# Patient Record
Sex: Female | Born: 1960
Health system: Southern US, Community
[De-identification: ages and names within clinical notes are randomized; demographics above are authoritative.]

## PROBLEM LIST (undated history)

## (undated) DIAGNOSIS — K219 Gastro-esophageal reflux disease without esophagitis: Secondary | ICD-10-CM

## (undated) DIAGNOSIS — C4490 Unspecified malignant neoplasm of skin, unspecified: Secondary | ICD-10-CM

## (undated) DIAGNOSIS — K589 Irritable bowel syndrome without diarrhea: Secondary | ICD-10-CM

## (undated) DIAGNOSIS — T7492XA Unspecified child maltreatment, confirmed, initial encounter: Secondary | ICD-10-CM

## (undated) DIAGNOSIS — Z8711 Personal history of peptic ulcer disease: Secondary | ICD-10-CM

## (undated) DIAGNOSIS — F32A Depression, unspecified: Secondary | ICD-10-CM

## (undated) DIAGNOSIS — F419 Anxiety disorder, unspecified: Secondary | ICD-10-CM

## (undated) DIAGNOSIS — IMO0002 Reserved for concepts with insufficient information to code with codable children: Secondary | ICD-10-CM

## (undated) DIAGNOSIS — Z8719 Personal history of other diseases of the digestive system: Secondary | ICD-10-CM

## (undated) DIAGNOSIS — C4492 Squamous cell carcinoma of skin, unspecified: Secondary | ICD-10-CM

## (undated) DIAGNOSIS — S2239XA Fracture of one rib, unspecified side, initial encounter for closed fracture: Secondary | ICD-10-CM

## (undated) DIAGNOSIS — F329 Major depressive disorder, single episode, unspecified: Secondary | ICD-10-CM

## (undated) DIAGNOSIS — J449 Chronic obstructive pulmonary disease, unspecified: Secondary | ICD-10-CM

## (undated) DIAGNOSIS — F410 Panic disorder [episodic paroxysmal anxiety] without agoraphobia: Secondary | ICD-10-CM

## (undated) DIAGNOSIS — C4491 Basal cell carcinoma of skin, unspecified: Secondary | ICD-10-CM

## (undated) HISTORY — DX: Gastro-esophageal reflux disease without esophagitis: K21.9

## (undated) HISTORY — DX: Major depressive disorder, single episode, unspecified: F32.9

## (undated) HISTORY — DX: Unspecified malignant neoplasm of skin, unspecified: C44.90

## (undated) HISTORY — DX: Panic disorder (episodic paroxysmal anxiety): F41.0

## (undated) HISTORY — DX: Personal history of peptic ulcer disease: Z87.11

## (undated) HISTORY — DX: Basal cell carcinoma of skin, unspecified: C44.91

## (undated) HISTORY — DX: Biological father, perpetrator of maltreatment and neglect: T74.92XA

## (undated) HISTORY — DX: Squamous cell carcinoma of skin, unspecified: C44.92

## (undated) HISTORY — DX: Chronic obstructive pulmonary disease, unspecified: J44.9

## (undated) HISTORY — DX: Reserved for concepts with insufficient information to code with codable children: IMO0002

## (undated) HISTORY — DX: Personal history of other diseases of the digestive system: Z87.19

## (undated) HISTORY — DX: Anxiety disorder, unspecified: F41.9

## (undated) HISTORY — DX: Depression, unspecified: F32.A

## (undated) HISTORY — DX: Irritable bowel syndrome, unspecified: K58.9

## (undated) HISTORY — DX: Fracture of one rib, unspecified side, initial encounter for closed fracture: S22.39XA

---
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
## 1983-12-09 HISTORY — PX: OTHER SURGICAL HISTORY: SHX169

## 1984-12-08 DIAGNOSIS — S2249XA Multiple fractures of ribs, unspecified side, initial encounter for closed fracture: Secondary | ICD-10-CM

## 1984-12-08 HISTORY — DX: Multiple fractures of ribs, unspecified side, initial encounter for closed fracture: S22.49XA

## 2000-12-08 HISTORY — PX: TUBAL LIGATION: SHX77

## 2005-05-29 ENCOUNTER — Ambulatory Visit: Payer: Self-pay | Admitting: General Surgery

## 2005-06-20 ENCOUNTER — Ambulatory Visit: Payer: Self-pay | Admitting: Obstetrics and Gynecology

## 2005-07-24 ENCOUNTER — Ambulatory Visit: Payer: Self-pay | Admitting: Gastroenterology

## 2005-08-06 ENCOUNTER — Ambulatory Visit: Payer: Self-pay | Admitting: Gastroenterology

## 2006-03-05 ENCOUNTER — Ambulatory Visit: Payer: Self-pay | Admitting: Gastroenterology

## 2006-03-05 ENCOUNTER — Encounter: Payer: Self-pay | Admitting: Internal Medicine

## 2006-06-22 ENCOUNTER — Ambulatory Visit: Payer: Self-pay | Admitting: Obstetrics and Gynecology

## 2006-07-13 ENCOUNTER — Encounter: Payer: Self-pay | Admitting: Internal Medicine

## 2007-08-10 ENCOUNTER — Ambulatory Visit: Payer: Self-pay | Admitting: Obstetrics and Gynecology

## 2008-08-03 ENCOUNTER — Ambulatory Visit: Payer: Self-pay | Admitting: Family Medicine

## 2008-12-26 ENCOUNTER — Ambulatory Visit: Payer: Self-pay | Admitting: Family Medicine

## 2008-12-26 DIAGNOSIS — F331 Major depressive disorder, recurrent, moderate: Secondary | ICD-10-CM | POA: Insufficient documentation

## 2008-12-26 DIAGNOSIS — K589 Irritable bowel syndrome without diarrhea: Secondary | ICD-10-CM | POA: Insufficient documentation

## 2008-12-27 DIAGNOSIS — F411 Generalized anxiety disorder: Secondary | ICD-10-CM

## 2008-12-27 DIAGNOSIS — K219 Gastro-esophageal reflux disease without esophagitis: Secondary | ICD-10-CM | POA: Insufficient documentation

## 2008-12-27 DIAGNOSIS — Z8719 Personal history of other diseases of the digestive system: Secondary | ICD-10-CM | POA: Insufficient documentation

## 2008-12-27 DIAGNOSIS — J309 Allergic rhinitis, unspecified: Secondary | ICD-10-CM | POA: Insufficient documentation

## 2008-12-27 DIAGNOSIS — R32 Unspecified urinary incontinence: Secondary | ICD-10-CM | POA: Insufficient documentation

## 2008-12-27 HISTORY — DX: Generalized anxiety disorder: F41.1

## 2009-01-22 ENCOUNTER — Ambulatory Visit: Payer: Self-pay | Admitting: Family Medicine

## 2009-01-22 ENCOUNTER — Encounter: Payer: Self-pay | Admitting: Family Medicine

## 2009-01-24 ENCOUNTER — Encounter (INDEPENDENT_AMBULATORY_CARE_PROVIDER_SITE_OTHER): Payer: Self-pay | Admitting: *Deleted

## 2009-02-19 LAB — HM PAP SMEAR

## 2009-02-28 ENCOUNTER — Ambulatory Visit: Payer: Self-pay | Admitting: Family Medicine

## 2009-02-28 DIAGNOSIS — R5381 Other malaise: Secondary | ICD-10-CM | POA: Insufficient documentation

## 2009-02-28 DIAGNOSIS — R5383 Other fatigue: Secondary | ICD-10-CM

## 2009-03-14 ENCOUNTER — Ambulatory Visit: Payer: Self-pay | Admitting: Family Medicine

## 2009-03-15 LAB — CONVERTED CEMR LAB
Albumin: 4.2 g/dL (ref 3.5–5.2)
Basophils Relative: 0.6 % (ref 0.0–3.0)
Bilirubin, Direct: 0.1 mg/dL (ref 0.0–0.3)
CO2: 27 meq/L (ref 19–32)
Calcium: 9.4 mg/dL (ref 8.4–10.5)
Creatinine, Ser: 0.6 mg/dL (ref 0.4–1.2)
Eosinophils Relative: 3 % (ref 0.0–5.0)
HDL: 37.9 mg/dL — ABNORMAL LOW (ref >39.00)
Hemoglobin: 16.4 g/dL — ABNORMAL HIGH (ref 12.0–15.0)
Lipase: 21 units/L (ref 11.0–59.0)
Lymphocytes Relative: 18.8 % (ref 12.0–46.0)
MCHC: 35.3 g/dL (ref 30.0–36.0)
Monocytes Relative: 4.9 % (ref 3.0–12.0)
Neutro Abs: 6.5 10*3/uL (ref 1.4–7.7)
RBC: 5.25 M/uL — ABNORMAL HIGH (ref 3.87–5.11)
Total CHOL/HDL Ratio: 5
Total Protein: 6.9 g/dL (ref 6.0–8.3)
Triglycerides: 88 mg/dL (ref 0.0–149.0)

## 2009-05-28 ENCOUNTER — Telehealth: Payer: Self-pay | Admitting: Family Medicine

## 2009-05-29 ENCOUNTER — Ambulatory Visit: Payer: Self-pay | Admitting: Family Medicine

## 2009-05-29 DIAGNOSIS — K259 Gastric ulcer, unspecified as acute or chronic, without hemorrhage or perforation: Secondary | ICD-10-CM | POA: Insufficient documentation

## 2009-05-29 LAB — CONVERTED CEMR LAB
Eosinophils Relative: 3.5 % (ref 0.0–5.0)
H Pylori IgG: NEGATIVE
HCT: 46.2 % — ABNORMAL HIGH (ref 36.0–46.0)
Hemoglobin: 16.4 g/dL — ABNORMAL HIGH (ref 12.0–15.0)
Lymphs Abs: 1.8 10*3/uL (ref 0.7–4.0)
Monocytes Relative: 7.5 % (ref 3.0–12.0)
Neutro Abs: 4.8 10*3/uL (ref 1.4–7.7)
WBC: 7.5 10*3/uL (ref 4.5–10.5)

## 2009-05-30 ENCOUNTER — Telehealth: Payer: Self-pay | Admitting: Family Medicine

## 2009-06-12 ENCOUNTER — Telehealth: Payer: Self-pay | Admitting: Family Medicine

## 2009-07-02 ENCOUNTER — Ambulatory Visit: Payer: Self-pay | Admitting: Family Medicine

## 2009-07-02 DIAGNOSIS — M7710 Lateral epicondylitis, unspecified elbow: Secondary | ICD-10-CM | POA: Insufficient documentation

## 2009-07-04 ENCOUNTER — Telehealth: Payer: Self-pay | Admitting: Family Medicine

## 2009-07-25 ENCOUNTER — Ambulatory Visit: Payer: Self-pay | Admitting: Internal Medicine

## 2009-07-26 ENCOUNTER — Telehealth: Payer: Self-pay | Admitting: Internal Medicine

## 2009-09-03 ENCOUNTER — Ambulatory Visit: Payer: Self-pay | Admitting: Family Medicine

## 2009-09-03 DIAGNOSIS — G4700 Insomnia, unspecified: Secondary | ICD-10-CM | POA: Insufficient documentation

## 2009-09-18 ENCOUNTER — Telehealth: Payer: Self-pay | Admitting: Family Medicine

## 2009-10-15 ENCOUNTER — Ambulatory Visit: Payer: Self-pay | Admitting: Family Medicine

## 2009-11-12 ENCOUNTER — Ambulatory Visit: Payer: Self-pay | Admitting: Family Medicine

## 2009-11-23 ENCOUNTER — Telehealth: Payer: Self-pay | Admitting: Family Medicine

## 2009-12-13 ENCOUNTER — Ambulatory Visit: Payer: Self-pay | Admitting: Internal Medicine

## 2009-12-13 ENCOUNTER — Ambulatory Visit: Payer: Self-pay

## 2009-12-13 ENCOUNTER — Encounter: Payer: Self-pay | Admitting: Family Medicine

## 2009-12-13 ENCOUNTER — Encounter (INDEPENDENT_AMBULATORY_CARE_PROVIDER_SITE_OTHER): Payer: Self-pay | Admitting: *Deleted

## 2009-12-13 ENCOUNTER — Ambulatory Visit (HOSPITAL_COMMUNITY): Admission: RE | Admit: 2009-12-13 | Discharge: 2009-12-13 | Payer: Self-pay | Admitting: Family Medicine

## 2009-12-24 ENCOUNTER — Ambulatory Visit: Payer: Self-pay | Admitting: Family Medicine

## 2009-12-24 DIAGNOSIS — F17200 Nicotine dependence, unspecified, uncomplicated: Secondary | ICD-10-CM | POA: Insufficient documentation

## 2009-12-24 DIAGNOSIS — J449 Chronic obstructive pulmonary disease, unspecified: Secondary | ICD-10-CM | POA: Insufficient documentation

## 2009-12-25 ENCOUNTER — Telehealth: Payer: Self-pay | Admitting: Family Medicine

## 2009-12-26 ENCOUNTER — Encounter: Admission: RE | Admit: 2009-12-26 | Discharge: 2009-12-26 | Payer: Self-pay | Admitting: Family Medicine

## 2010-02-18 ENCOUNTER — Ambulatory Visit: Payer: Self-pay | Admitting: Family Medicine

## 2010-03-11 ENCOUNTER — Ambulatory Visit: Payer: Self-pay | Admitting: Family Medicine

## 2010-03-18 ENCOUNTER — Telehealth: Payer: Self-pay | Admitting: Family Medicine

## 2010-03-22 ENCOUNTER — Telehealth: Payer: Self-pay | Admitting: Family Medicine

## 2010-04-18 ENCOUNTER — Ambulatory Visit: Payer: Self-pay | Admitting: Family Medicine

## 2010-04-18 DIAGNOSIS — R252 Cramp and spasm: Secondary | ICD-10-CM | POA: Insufficient documentation

## 2010-04-22 ENCOUNTER — Encounter: Payer: Self-pay | Admitting: Family Medicine

## 2010-04-22 LAB — CONVERTED CEMR LAB
BUN: 11 mg/dL (ref 6–23)
Basophils Absolute: 0 10*3/uL (ref 0.0–0.1)
Basophils Relative: 0.4 % (ref 0.0–3.0)
CO2: 28 meq/L (ref 19–32)
Calcium: 9.5 mg/dL (ref 8.4–10.5)
Chloride: 101 meq/L (ref 96–112)
Creatinine, Ser: 0.6 mg/dL (ref 0.4–1.2)
Direct LDL: 119.8 mg/dL
Eosinophils Absolute: 0.2 10*3/uL (ref 0.0–0.7)
Eosinophils Relative: 2.2 % (ref 0.0–5.0)
Ferritin: 98.7 ng/mL (ref 10.0–291.0)
GFR calc non Af Amer: 119.96 mL/min (ref >60)
Glucose, Bld: 110 mg/dL — ABNORMAL HIGH (ref 70–99)
HCT: 46.6 % — ABNORMAL HIGH (ref 36.0–46.0)
Hemoglobin: 16.3 g/dL — ABNORMAL HIGH (ref 12.0–15.0)
Lymphocytes Relative: 24.9 % (ref 12.0–46.0)
Lymphs Abs: 2.8 10*3/uL (ref 0.7–4.0)
MCHC: 35 g/dL (ref 30.0–36.0)
MCV: 90.2 fL (ref 78.0–100.0)
Monocytes Absolute: 0.7 10*3/uL (ref 0.1–1.0)
Monocytes Relative: 5.9 % (ref 3.0–12.0)
Neutro Abs: 7.4 10*3/uL (ref 1.4–7.7)
Neutrophils Relative %: 66.6 % (ref 43.0–77.0)
Platelets: 220 10*3/uL (ref 150.0–400.0)
Potassium: 4 meq/L (ref 3.5–5.1)
RBC: 5.17 M/uL — ABNORMAL HIGH (ref 3.87–5.11)
RDW: 13.8 % (ref 11.5–14.6)
Sodium: 139 meq/L (ref 135–145)
TSH: 1.01 microintl units/mL (ref 0.35–5.50)
WBC: 11.1 10*3/uL — ABNORMAL HIGH (ref 4.5–10.5)

## 2010-05-13 ENCOUNTER — Telehealth: Payer: Self-pay | Admitting: Family Medicine

## 2010-06-03 ENCOUNTER — Telehealth (INDEPENDENT_AMBULATORY_CARE_PROVIDER_SITE_OTHER): Payer: Self-pay | Admitting: *Deleted

## 2010-08-16 ENCOUNTER — Telehealth: Payer: Self-pay | Admitting: Family Medicine

## 2010-09-21 IMAGING — MG MAM DGTL SCREENING MAMMO W/CAD
1 series · 4 of 4 positions shown · non-contrast
Comparison: none

REASON FOR EXAM: scr mammo
COMMENTS:

[R CC · right · 4 of 4 slices shown]
[im 1/4]
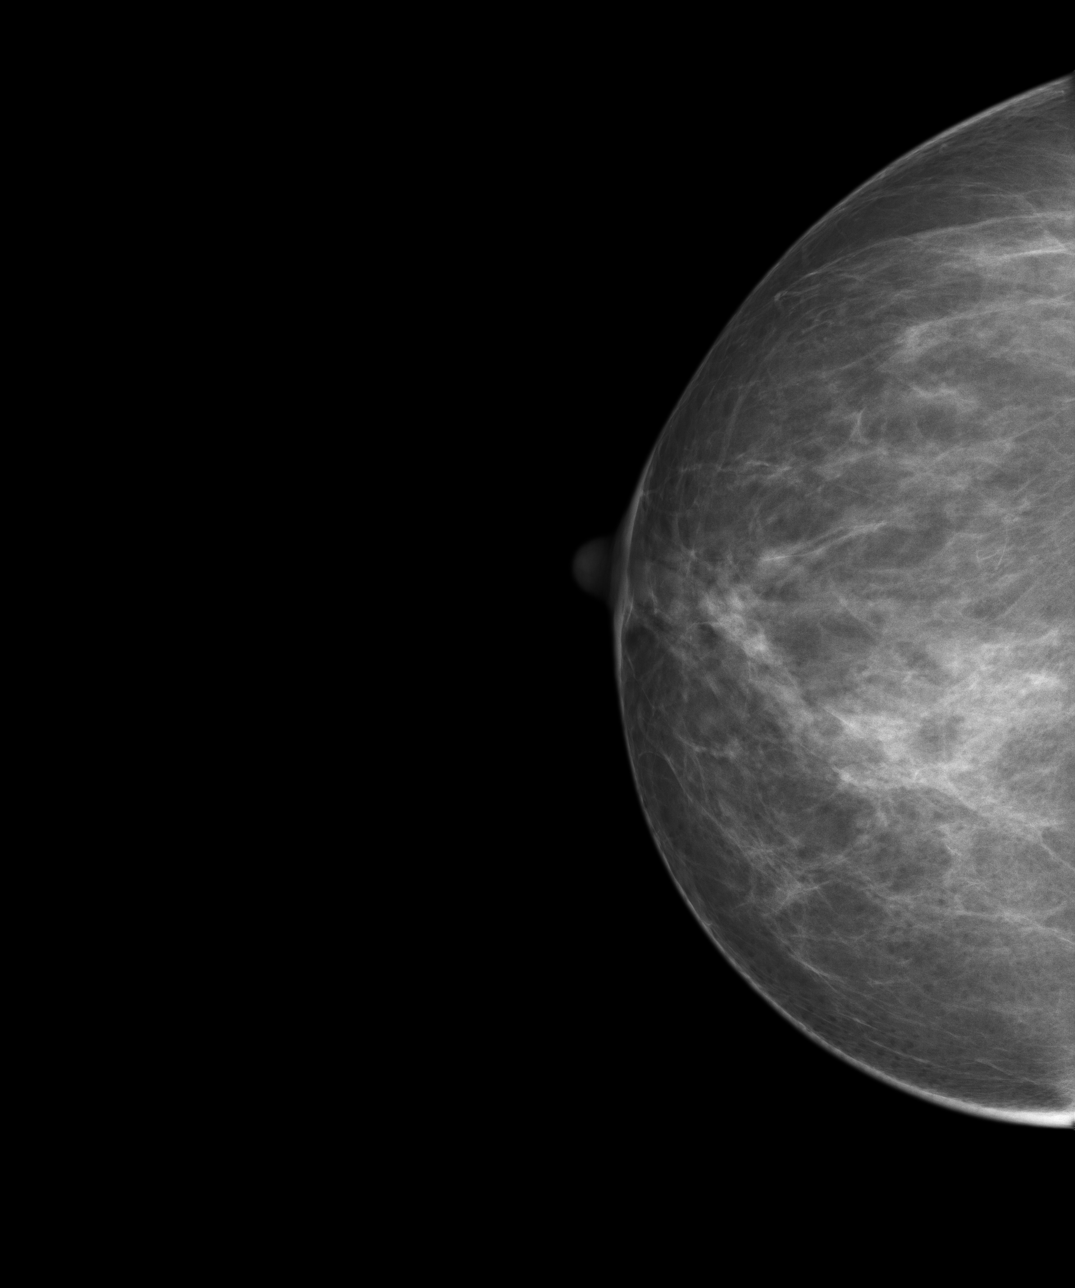
[im 2/4]
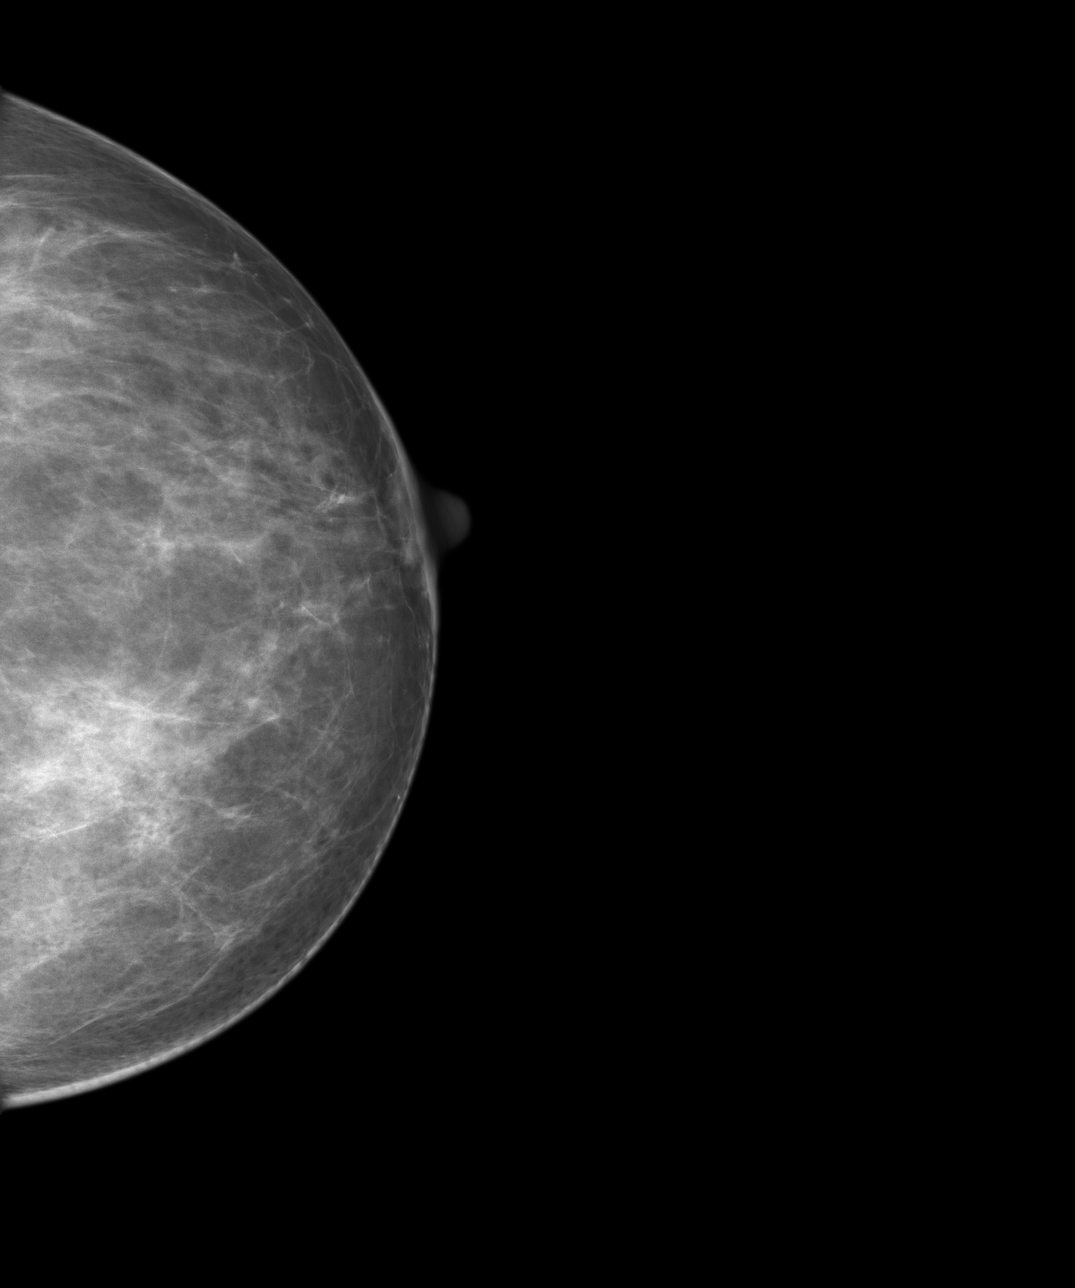
[im 3/4]
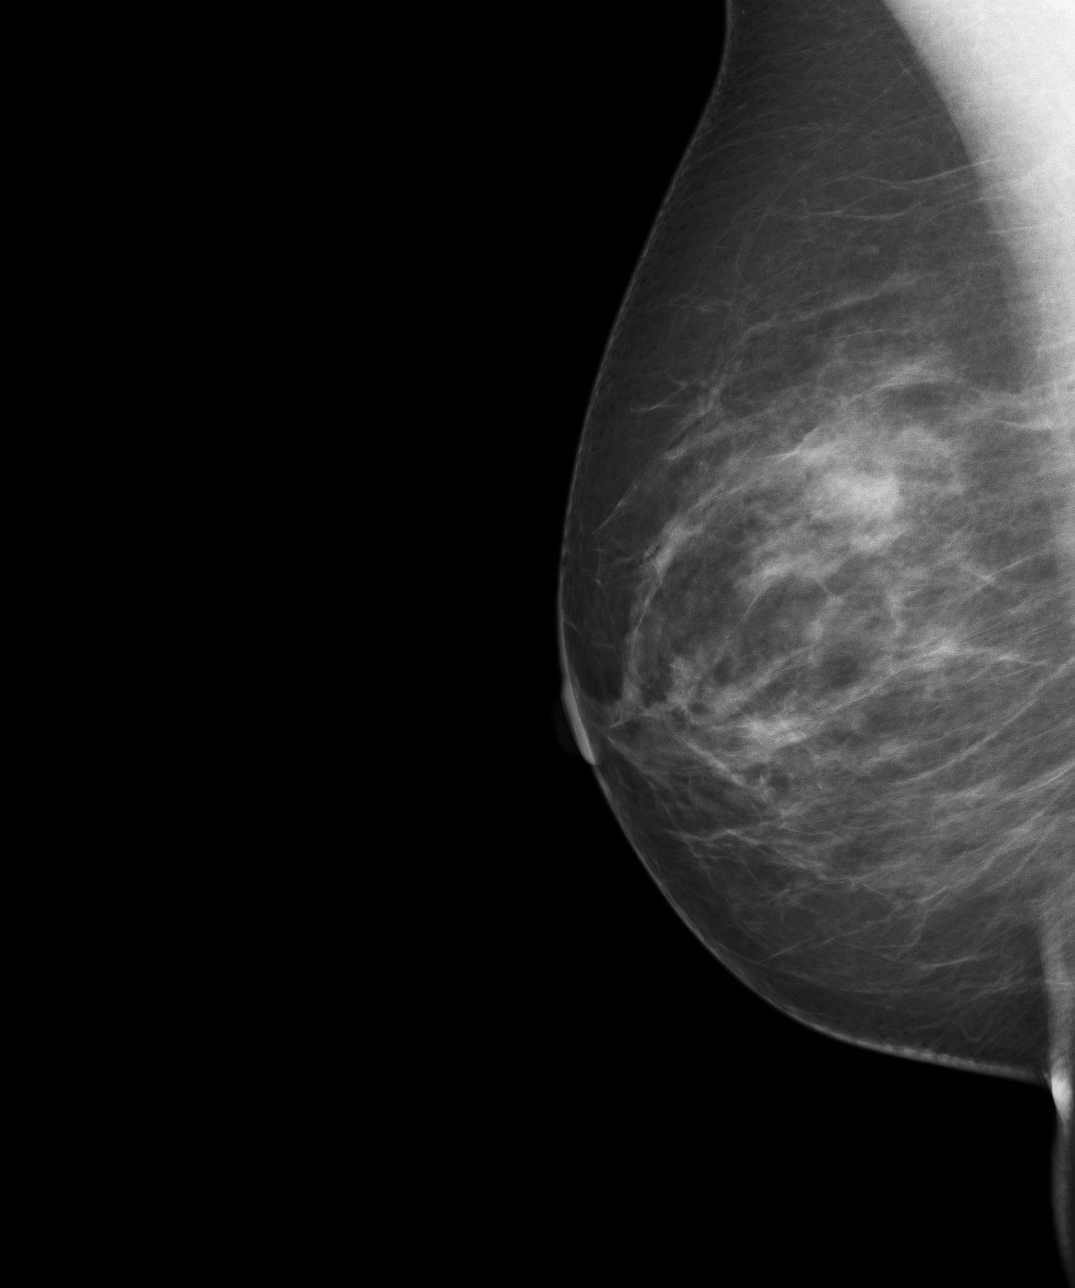
[im 4/4]
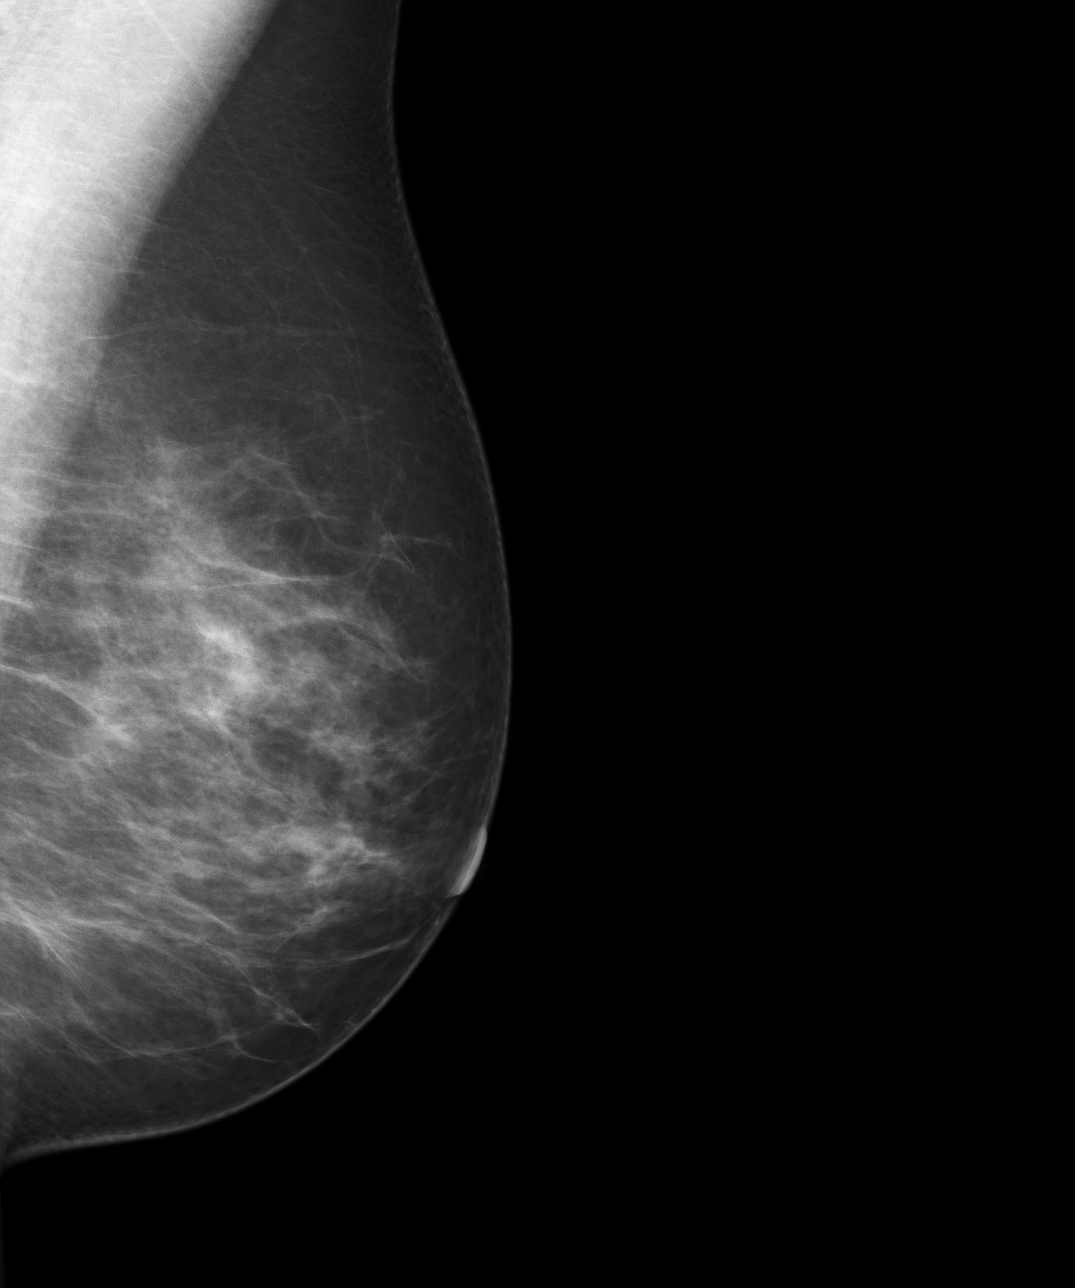

[4 of 4 positions shown; findings below may reference images not displayed]

PROCEDURE:     MAM - MAM DGTL SCREENING MAMMO W/CAD  - January 22, 2009  [DATE]

RESULT:       Comparison is made to a study 10 August, 2007,22 June, 2006 and 31 March, 2002.

The breasts are moderately dense.  There is no dominant mass. I see no
malignant-appearing grouping of microcalcification.  No area of new
architectural distortion is seen.
IMPRESSION: 1.     I see no finding suspicious for malignancy.
2.     BI-RADS:  Category 2-Benign Finding.

RECOMMENDATION:  Please continue to encourage yearly mammographic followup.

A NEGATIVE MAMMOGRAM REPORT DOES NOT PRECLUDE BIOPSY OR OTHER EVALUATION OF
A CLINICALLY PALPABLE OR OTHERWISE SUSPICIOUS MASS OR LESION.  BREAST CANCER
MAY NOT BE DETECTED BY MAMMOGRAPHY IN UP TO 10% OF CASES.

## 2010-10-03 ENCOUNTER — Encounter: Payer: Self-pay | Admitting: Family Medicine

## 2010-10-03 ENCOUNTER — Ambulatory Visit: Payer: Self-pay | Admitting: Family Medicine

## 2010-12-04 ENCOUNTER — Telehealth (INDEPENDENT_AMBULATORY_CARE_PROVIDER_SITE_OTHER): Payer: Self-pay | Admitting: *Deleted

## 2011-01-08 NOTE — Progress Notes (Signed)
Summary: call a nurse  Phone Note From Other Clinic   Caller: call a nurse Summary of Call: [facility] Triage Call Report Triage Record Num: [id number] Operator: Yvette Rack Patient Name: Lauriann Milillo Call Date & Time: 05/11/2010 4:58:03PM Patient Phone: [phone] PCP: Karleen Hampshire Copland Patient Gender: Female PCP Fax : [phone] Patient DOB: [birth year] Practice Name: [facility] Reason for Call: Pt states currently taking Citalopram 30 mg po qd. Pt states has enough medication for tomorrow (Sunday). RN instructed pt she will need to call back Monday am when office open to request further refills of medication. Caller verbalizes understanding. Protocol(s) Used: Medication Question Calls, No Triage (Adults) Recommended Outcome per Protocol: Call Provider within 24 Hours Reason for Outcome: Caller requesting a non urgent new prescription or refill and triager unable to refill per unit policy Care Advice:  ~ 05/11/2010 5:11:14PM Page 1 of 1 [email or web address] Initial call taken by: Laurie Branson CMA,  May 13, 2010 10:08 AM  Follow-up for Phone Call        can you help fix her citalopram script  recently refilled, was supposed to have been 1 1/2 tabs by mouth daily, #45, 5 refills Follow-up by: Spencer Copland MD,  May 13, 2010 5:32 PM    Prescriptions: CITALOPRAM HYDROBROMIDE 20 MG TABS (CITALOPRAM HYDROBROMIDE) take one and a half  tablets by mouth daily  #45 x 4   Entered by:   Heather Woodard CMA (AAMA)   Authorized by:   Spencer Copland MD   Signed by:   Heather Woodard CMA (AAMA) on 05/14/2010   Method used:   [facility]  [address] [id number]       [address]       [place], [place]  [address]       Ph: [phone]       Fax: [phone]   RxID:   [id number]

## 2011-01-08 NOTE — Miscellaneous (Signed)
Summary: Controlled Substances Contract  Controlled Substances Contract   Imported By: Maryln Gottron 10/09/2010 10:53:43  _____________________________________________________________________  External Attachment:    Type:   Image     Comment:   External Document

## 2011-01-08 NOTE — Progress Notes (Signed)
Summary: refill request for alprazolam  Phone Note Refill Request   Refills Requested: Medication #1:  ALPRAZOLAM 0.25 MG TBDP take one tablet once daily   Last Refilled: 02/22/2010 Faxed request from [facility], phone [phone]  Initial call taken by: Lowella Petties CMA,  March 22, 2010 8:56 AM  Follow-up for Phone Call        [facility] [place] Follow-up by: Lowella Petties CMA,  March 22, 2010 5:43 PM    Prescriptions: ALPRAZOLAM 0.25 MG TBDP (ALPRAZOLAM) take one tablet once daily  #30 x 1   Entered and Authorized by:   Hannah Beat MD   Signed by:   Hannah Beat MD on 03/22/2010   Method used:   Telephoned to ...       [facility] [phone]* (retail)       [address]       [place], [place]  [address]       Ph: [phone]       Fax: [phone]   RxID:   [id number]
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
## 2011-01-08 NOTE — Progress Notes (Signed)
Summary: chest xray  Phone Note Call from Patient   Caller: Patient Summary of Call: Patient wants to know if you can order a chest x-ray for her to check her lungs since she was diagnosed with copd. Initial call taken by: Benny Lennert CMA Duncan Dull),  December 25, 2009 8:32 AM  Follow-up for Phone Call        she has copd clinically and diagnosed with spirometry yesterday. The diagnosis is certain.  With her long history of smoking, a chest xray is reasonable Follow-up by: Hannah Beat MD,  December 25, 2009 9:00 AM  Additional Follow-up for Phone Call Additional follow up Details #1::        [facility] on 12/26/2009 patient will walk in , order faxed to [phone] on 12/25/2009.  Carlton Adam  December 25, 2009 4:42 PM  Additional Follow-up by: Carlton Adam,  December 25, 2009 4:42 PM
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
## 2011-01-08 NOTE — Progress Notes (Signed)
Summary: Need precription...  Phone Note Call from Patient   Caller: Patient Call For: [id number] Summary of Call: Pt called says the Pharmacy does not have the new precriptions for Citalopram Hydrobromide 20mg  45 x 4 was not recd by the pharmacy.  [facility]Marland KitchenDaine Gip  June 03, 2010 8:59 AM  Initial call taken by: Daine Gip,  June 03, 2010 9:00 AM  Follow-up for Phone Call        rx resent because pharmacy did not recieve Follow-up by: Benny Lennert CMA Duncan Dull),  June 03, 2010 9:04 AM    Prescriptions: CITALOPRAM HYDROBROMIDE 20 MG TABS (CITALOPRAM HYDROBROMIDE) take one and a half  tablets by mouth daily  #45 x 4   Entered by:   Benny Lennert CMA (AAMA)   Authorized by:   Hannah Beat MD   Signed by:   Benny Lennert CMA (AAMA) on 06/03/2010   Method used:   Electronically to        [facility] [phone]* (retail)       [address]       [place], [place]  [address]       Ph: [phone]       Fax: [phone]   RxID:   [id number]

## 2011-01-08 NOTE — Progress Notes (Signed)
Summary: alprazolam   Phone Note Refill Request Message from:  Fax from Pharmacy on August 16, 2010 9:53 AM  Refills Requested: Medication #1:  ALPRAZOLAM 0.25 MG TBDP take one tablet once daily   Last Refilled: 05/11/2010 [facility] [address] [phone]  Initial call taken by: Melody Comas,  August 16, 2010 9:53 AM  Follow-up for Phone Call        Rx called to pharmacy Follow-up by: Linde Gillis CMA Duncan Dull),  August 16, 2010 10:19 AM    Prescriptions: ALPRAZOLAM 0.25 MG TBDP (ALPRAZOLAM) take one tablet once daily  #30 x 1   Entered and Authorized by:   Hannah Beat MD   Signed by:   Hannah Beat MD on 08/16/2010   Method used:   Telephoned to ...       [facility] [phone]* (retail)       [address]       [place], [place]  [address]       Ph: [phone]       Fax: [phone]   RxID:   [id number]

## 2011-01-08 NOTE — Miscellaneous (Signed)
Summary: mirapex called in  Clinical Lists Changes  Medications: Added new medication of MIRAPEX 0.125 MG TABS (PRAMIPEXOLE DIHYDROCHLORIDE) take one by mouth every night for one week then increase to 2 by mouth every night. - Signed Rx of MIRAPEX 0.125 MG TABS (PRAMIPEXOLE DIHYDROCHLORIDE) take one by mouth every night for one week then increase to 2 by mouth every night.;  #60 x 5;  Signed;  Entered by: Lowella Petties CMA;  Authorized by: Hannah Beat MD;  Method used: Electronically to [id number] [address] [place], [place], [place]  [address], Ph: [phone], Fax: [phone]    Prescriptions: MIRAPEX 0.125 MG TABS (PRAMIPEXOLE DIHYDROCHLORIDE) take one by mouth every night for one week then increase to 2 by mouth every night.  #60 x 5   Entered by:   Lowella Petties CMA   Authorized by:   Hannah Beat MD   Signed by:   Lowella Petties CMA on 04/22/2010   Method used:   Electronically to        [facility]  [address] [phone]* (retail)       [address]       [place], [place]  [address]       Ph: [phone]       Fax: [phone]   RxID:   [id number]   Prior Medications: ALPRAZOLAM 0.25 MG TBDP (ALPRAZOLAM) take one tablet once daily CENTRUM  TABS (MULTIPLE VITAMINS-MINERALS) take one by mouth daily LANSOPRAZOLE 30 MG CPDR (LANSOPRAZOLE) 1 by mouth daily TRAZODONE HCL 100 MG TABS (TRAZODONE HCL) 1 by mouth at bedtime as needed sleep CITALOPRAM HYDROBROMIDE 20 MG TABS (CITALOPRAM HYDROBROMIDE) take one and a half  tablets by mouth daily ADVAIR DISKUS 250-50 MCG/DOSE AEPB (FLUTICASONE-SALMETEROL) 1 puff two times a day VENTOLIN HFA 108 (90 BASE) MCG/ACT AERS (ALBUTEROL SULFATE) 2 puffs every 4 hours as needed shortness of breath BUPROPION HCL 150 MG XR24H-TAB (BUPROPION HCL) 1 by mouth daily SUMATRIPTAN SUCCINATE 50 MG TABS (SUMATRIPTAN SUCCINATE) 1 by mouth at onset of headache. May repeat in 2 hours. TRIAMCINOLONE ACETONIDE 0.1 % CREA (TRIAMCINOLONE  ACETONIDE) Apply two times a day Current Allergies: ! PENICILLIN ! * HUMIBID DM

## 2011-01-08 NOTE — Progress Notes (Signed)
Summary: regarding rash  Phone Note Call from Patient Call back at Work Phone [phone]   Caller: Patient Call For: Hannah Beat MD Summary of Call: Pt was in last week with a rash.  She said the medicine has helped some but she had some stress and then she started itching and breaking out again.  She wonders if the rash is coming from anxiety.  She wants your opinion, should she increase her xanax or celexa? Initial call taken by: Lowella Petties CMA,  March 18, 2010 8:20 AM  Follow-up for Phone Call        Increase the Celexa to 1 1/2 tabs by mouth daily call in 1 1/2 by mouth daily, #45, 6 refills to her pharmacy  OK to take xanax as needed  Follow-up by: Hannah Beat MD,  March 18, 2010 9:32 AM    New/Updated Medications: CITALOPRAM HYDROBROMIDE 20 MG TABS (CITALOPRAM HYDROBROMIDE) take one and a half  tablets by mouth daily Prescriptions: CITALOPRAM HYDROBROMIDE 20 MG TABS (CITALOPRAM HYDROBROMIDE) take one and a half  tablets by mouth daily  #45 x 6   Entered by:   Benny Lennert CMA (AAMA)   Authorized by:   Hannah Beat MD   Signed by:   Benny Lennert CMA (AAMA) on 03/18/2010   Method used:   [facility] to        [facility] [phone]* (retail)       [address]       [place], [place]  [address]       Ph: [phone]       Fax: [phone]   RxID:   [id number]

## 2011-01-08 NOTE — Assessment & Plan Note (Signed)
Summary: ITCHY RASH/ lb   Vital Signs:  Patient profile:   50 year old female Height:      64 inches Weight:      136 pounds BMI:     23.43 Temp:     98.2 degrees F oral Pulse rate:   72 / minute Pulse rhythm:   regular BP sitting:   110 / 70  (left arm) Cuff size:   regular  Vitals Entered By: Linde Gillis CMA Duncan Dull) (March 11, 2010 11:53 AM) CC: rash   History of Present Illness: 50 year old:  Rash:  Started saturday  macular reddish rash on arms, abd, back, legs some excoriation took benadryl  ROS: GEN: No acute illnesses, no fevers, chills, sweats, fatigue, weight loss, or URI sx. GI: No n/v/d Pulm: No SOB, cough, wheezing Interactive and getting along well at home.  Otherwise, ROS is as per the HPI.   GEN: Well-developed,well-nourished,in no acute distress; alert,appropriate and cooperative throughout examination HEENT: Normocephalic and atraumatic without obvious abnormalities. No apparent alopecia or balding. Ears, externally no deformities PULM: Breathing comfortably in no respiratory distress EXT: No clubbing, cyanosis, or edema PSYCH: Normally interactive. Cooperative during the interview. Pleasant. Friendly and conversant. Not anxious or depressed appearing. Normal, full affect.   SKIN: macular diffuse rash, no scale, some excoriation  Allergies: 1)  ! Penicillin 2)  ! * Humibid Dm   Impression & Recommendations:  Problem # 1:  RASH-NONVESICULAR (ICD-782.1) unclear etiology supportive, antihistamines and TAC  Her updated medication list for this problem includes:    Triamcinolone Acetonide 0.1 % Crea (Triamcinolone acetonide) .Marland Kitchen... Apply two times a day  Complete Medication List: 1)  Alprazolam 0.25 Mg Tbdp (Alprazolam) .... Take one tablet once daily 2)  Centrum Tabs (Multiple vitamins-minerals) .... Take one by mouth daily 3)  Lansoprazole 30 Mg Cpdr (Lansoprazole) .Marland Kitchen.. 1 by mouth daily 4)  Trazodone Hcl 100 Mg Tabs (Trazodone hcl) .Marland Kitchen.. 1  by mouth at bedtime as needed sleep 5)  Citalopram Hydrobromide 20 Mg Tabs (Citalopram hydrobromide) .... Take one tablet by mouth daily 6)  Advair Diskus 250-50 Mcg/dose Aepb (Fluticasone-salmeterol) .Marland Kitchen.. 1 puff two times a day 7)  Ventolin Hfa 108 (90 Base) Mcg/act Aers (Albuterol sulfate) .... 2 puffs every 4 hours as needed shortness of breath 8)  Bupropion Hcl 150 Mg Xr24h-tab (Bupropion hcl) .Marland Kitchen.. 1 by mouth daily 9)  Sumatriptan Succinate 50 Mg Tabs (Sumatriptan succinate) .Marland Kitchen.. 1 by mouth at onset of headache. may repeat in 2 hours. 10)  Triamcinolone Acetonide 0.1 % Crea (Triamcinolone acetonide) .... Apply two times a day  Patient Instructions: 1)  Generic Loratidine (Claritin) 2)  Benadryl 1/2 tab or 1 tab at night Prescriptions: TRIAMCINOLONE ACETONIDE 0.1 % CREA (TRIAMCINOLONE ACETONIDE) Apply two times a day  #1 pound x 0   Entered and Authorized by:   Hannah Beat MD   Signed by:   Hannah Beat MD on 03/11/2010   Method used:   [facility]        [facility]  [address] [phone]* (retail)       [address]       [place], [place]  [address]       Ph: [phone]       Fax: [phone]   RxID:   [id number]   Current Allergies (reviewed today): ! PENICILLIN ! * HUMIBID DM

## 2011-01-08 NOTE — Assessment & Plan Note (Signed)
Summary: 2 appt6 WK F/U DLO   Vital Signs:  Patient profile:   50 year old female Height:      64 inches Weight:      140.2 pounds BMI:     24.15 Temp:     97.7 degrees F oral Pulse rate:   72 / minute Pulse rhythm:   regular BP sitting:   120 / 82  (left arm) Cuff size:   regular  Vitals Entered By: Benny Lennert CMA Duncan Dull) (December 24, 2009 2:00 PM)  History of Present Illness:  Dep on Celexa, stable, crying occ - some on christmas, but a lot better than before.  SOB: reviewed prior notes. Stress echo normal, reviewed with patient. Feels heart racing sometimes.  SOB, coughing, SOB with exercise, worse now when goes out into cold air. Longstanding smoker. Sister and mother had COPD Able to walk about 20 feet or so sometimes before she gets some symptoms. No history of asthma.   Usually first thing in the morning when going into the cold weather.      Preventive Screening-Counseling & Management  Alcohol-Tobacco     Smoking Status: current     Smoking Cessation Counseling: yes     Smoke Cessation Stage: contemplative     Packs/Day: 1.0     Year Started: 1975     Pack years: 56     Tobacco Counseling: to quit use of tobacco products  Allergies: 1)  ! Penicillin 2)  ! * Humibid Dm  Past History:  Past Medical History: Depression, severe Skin CA, Basal & Squam GERD Gastrointestinal hemorrhage, hx of, 1981 Gastric Ulcer, hx Allergic rhinitis Urinary incontinence Anxiety / Panic Attacks IBS Broken ribs, 1986 COPD  Past Surgical History: Tubal ligation, 2002 Tonsillectomy, 1980 Tendonitis, 1985, DeQuairvain's Surgery  Past History:  Care Management: 12/24/2009:  Pulmonary: Spirometry: FEV1 = 34% predicted, FEV1% = 45% predicted Best FEV1% = 36  Social History: Packs/Day:  1.0  Review of Systems General:  Complains of fatigue. CV:  Complains of shortness of breath with exertion; denies chest pain or discomfort. Resp:  Complains of cough,  shortness of breath, and sputum productive.  Physical Exam  General:  Well-developed,well-nourished,in no acute distress; alert,appropriate and cooperative throughout examination Head:  Normocephalic and atraumatic without obvious abnormalities. No apparent alopecia or balding. Ears:  no external deformities.   Nose:  no external deformity.   Neck:  No deformities, masses, or tenderness noted. Lungs:  Normal respiratory effort, chest expands symmetrically. Lungs are clear to auscultation, no crackles or wheezes. Heart:  Normal rate and regular rhythm. S1 and S2 normal without gallop, murmur, click, rub or other extra sounds. Extremities:  No clubbing, cyanosis, edema, or deformity noted with normal full range of motion of all joints.   Neurologic:  gait normal.   Cervical Nodes:  No lymphadenopathy noted Psych:  Cognition and judgment appear intact. Alert and cooperative with normal attention span and concentration. No apparent delusions, illusions, hallucinations   Impression & Recommendations:  Problem # 1:  COPD (ICD-496) Assessment New  COPD, severe by GOLD criteria.  Start Advair, reviewed technique, diagnosis.  Her updated medication list for this problem includes:    Advair Diskus 250-50 Mcg/dose Aepb (Fluticasone-salmeterol) .Marland Kitchen... 1 puff two times a day    Ventolin Hfa 108 (90 Base) Mcg/act Aers (Albuterol sulfate) .Marland Kitchen... 2 puffs every 4 hours as needed shortness of breath  Orders: Spirometry w/Graph (94010)  Problem # 2:  TOBACCO USE (ICD-305.1)  The patient  does smoke cigarettes, and we discussed that tobacco is harmful to one's overall health, and that likely quitting smoking would be the single best thing that they could do for their health.   Contemplative.  Orders: Tobacco use cessation intermediate 3-10 minutes (99406)  Complete Medication List: 1)  Alprazolam 0.25 Mg Tbdp (Alprazolam) .... Take one tablet once daily 2)  Centrum Tabs (Multiple  vitamins-minerals) .... Take one by mouth daily 3)  Lansoprazole 30 Mg Cpdr (Lansoprazole) .Marland Kitchen.. 1 by mouth daily 4)  Trazodone Hcl 100 Mg Tabs (Trazodone hcl) .Marland Kitchen.. 1 by mouth at bedtime as needed sleep 5)  Citalopram Hydrobromide 20 Mg Tabs (Citalopram hydrobromide) .... Take one tablet by mouth daily 6)  Advair Diskus 250-50 Mcg/dose Aepb (Fluticasone-salmeterol) .Marland Kitchen.. 1 puff two times a day 7)  Ventolin Hfa 108 (90 Base) Mcg/act Aers (Albuterol sulfate) .... 2 puffs every 4 hours as needed shortness of breath  Patient Instructions: 1)  f/u 6 weeks Prescriptions: VENTOLIN HFA 108 (90 BASE) MCG/ACT AERS (ALBUTEROL SULFATE) 2 puffs every 4 hours as needed shortness of breath  #1 x 2   Entered and Authorized by:   Hannah Beat MD   Signed by:   Hannah Beat MD on 12/24/2009   Method used:   [facility]        [facility]  [address] [facility] [address] [phone]* (retail)       [address]       [place], [place]  [address]       Ph: [phone]       Fax: [phone]   RxID:   [id number] ADVAIR DISKUS 250-50 MCG/DOSE AEPB (FLUTICASONE-SALMETEROL) 1 puff two times a day  #1 x 5   Entered and Authorized by:   Hannah Beat MD   Signed by:   Hannah Beat MD on 12/24/2009   Method used:   [facility]        [facility]  [address] [phone][facility] (retail)       [address]       [place], [place]  [address]       Ph: [phone]       Fax: [phone]   RxID:   [id number]

## 2011-01-08 NOTE — Miscellaneous (Signed)
Summary: Outpatient Coinsurance Notice  Outpatient Coinsurance Notice   Imported By: Marylou Mccoy 12/18/2009 15:53:51  _____________________________________________________________________  External Attachment:    Type:   Image     Comment:   External Document
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
## 2011-01-09 NOTE — Progress Notes (Signed)
Summary: refill request from alprazolam  Phone Note Refill Request Message from:  Patient  Refills Requested: Medication #1:  ALPRAZOLAM 0.25 MG TBDP take 1/2  tablet once daily   Last Refilled: 10/20/2010 [facility], [phone].  Initial call taken by: Lowella Petties CMA, AAMA,  December 04, 2010 10:50 AM  Follow-up for Phone Call        Rx called to pharmacy Follow-up by: Benny Lennert CMA Duncan Dull),  December 04, 2010 11:05 AM    Prescriptions: ALPRAZOLAM 0.25 MG TBDP (ALPRAZOLAM) take 1/2  tablet once daily  #30 x 1   Entered and Authorized by:   Hannah Beat MD   Signed by:   Hannah Beat MD on 12/04/2010   Method used:   Telephoned to ...       [facility] [phone]* (retail)       [address]       [place], [place]  [address]       Ph: [phone]       Fax: [phone]   RxID:   [id number]

## 2011-03-08 ENCOUNTER — Encounter: Payer: Self-pay | Admitting: Family Medicine

## 2011-05-29 ENCOUNTER — Encounter: Payer: Self-pay | Admitting: Family Medicine

## 2011-05-29 ENCOUNTER — Ambulatory Visit (INDEPENDENT_AMBULATORY_CARE_PROVIDER_SITE_OTHER): Payer: 59 | Admitting: Family Medicine

## 2011-05-29 VITALS — BP 120/70 | HR 92 | Temp 99.4°F | Ht 65.0 in | Wt 127.1 lb

## 2011-05-29 DIAGNOSIS — J069 Acute upper respiratory infection, unspecified: Secondary | ICD-10-CM

## 2011-05-29 MED ORDER — DOXYCYCLINE HYCLATE 100 MG PO CAPS: 100.0000 mg | ORAL_CAPSULE | Freq: Two times a day (BID) | ORAL | Status: AC

## 2011-05-29 NOTE — Assessment & Plan Note (Addendum)
1 day duration. Anticipate viral. Given smoker, h/o COPD, provided with doxy script to cover COPD exac and sinusitis in case any worsening. Supportive care for next few days first. Pt agrees. Quit smoking is key.

## 2011-05-29 NOTE — Patient Instructions (Signed)
I think you have upper respiratory infection. Push fluids and plenty of rest over the weekend. Lungs sounding ok today, but the most important thing to work on is quitting smoking. If any worsening in next 2-3 days, fill antibiotic (prescription provided today). advair samples provided today. Use nasal saline irrigation as well. Call us with questions.

## 2011-05-29 NOTE — Progress Notes (Signed)
  Subjective:    Patient ID: Nichole Hogan, female    DOB: 01/05/1961, 50 y.o.   MRN: [id number]  HPI CC: aches and chills  Presents with husband.  H/o COPD.  1d h/o not feeling well.  Last night started aching in shoulders and chest, chills.  This am awoke with ST, ear pain.  Aching better.  Blowing yellow mucous out of nose.  Congested, nausea.  Took alleve yesterday.  Feels breathing a bit more difficultly, more short winded.  Better today.  Increase cough, mildly productive of sputum.  Looks clear/brown.  Cough not keeping her up at night.  No fevers, but endorses chills.  no Charity fundraiser.  No abd pain, vomiting.  No new rashes.  No tooth pain   No sick contacts at home.  Smoking 1 ppd, down since yesterday.    Review of Systems Per HPI    Objective:   Physical Exam  Nursing note and vitals reviewed. Constitutional: She appears well-developed and well-nourished. No distress.  HENT:  Head: Normocephalic and atraumatic.  Right Ear: Hearing, tympanic membrane, external ear and ear canal normal.  Left Ear: Hearing, tympanic membrane, external ear and ear canal normal.  Nose: No mucosal edema or rhinorrhea. Right sinus exhibits no maxillary sinus tenderness and no frontal sinus tenderness. Left sinus exhibits no maxillary sinus tenderness and no frontal sinus tenderness.  Mouth/Throat: Uvula is midline, oropharynx is clear and moist and mucous membranes are normal. No oropharyngeal exudate.  Eyes: Conjunctivae and EOM are normal. Pupils are equal, round, and reactive to light. No scleral icterus.  Neck: Normal range of motion. Neck supple. No thyromegaly present.  Cardiovascular: Normal rate, regular rhythm, normal heart sounds and intact distal pulses.   No murmur heard. Pulmonary/Chest: Effort normal. No respiratory distress. She has no wheezes. She has no rales.       Coarse breath sounds  Lymphadenopathy:    She has no cervical adenopathy.  Skin: Skin is warm and dry. No rash noted.         Assessment & Plan:

## 2011-06-09 ENCOUNTER — Other Ambulatory Visit: Payer: Self-pay | Admitting: *Deleted

## 2011-06-09 MED ORDER — ALPRAZOLAM 0.25 MG PO TABS: 0.2500 mg | ORAL_TABLET | Freq: Every day | ORAL | Status: DC

## 2011-06-09 NOTE — Telephone Encounter (Signed)
Ok to refill.  #30, 1 refill 

## 2011-10-10 ENCOUNTER — Other Ambulatory Visit: Payer: Self-pay | Admitting: *Deleted

## 2011-10-13 MED ORDER — ALPRAZOLAM 0.25 MG PO TABS: 0.2500 mg | ORAL_TABLET | Freq: Every day | ORAL | Status: DC

## 2011-10-13 NOTE — Telephone Encounter (Signed)
Ok to refill #30 with 1 refill

## 2011-10-13 NOTE — Telephone Encounter (Signed)
rx called to pharmacy 

## 2011-10-13 NOTE — Telephone Encounter (Signed)
Please do

## 2011-12-08 ENCOUNTER — Telehealth: Payer: Self-pay | Admitting: Internal Medicine

## 2011-12-08 NOTE — Telephone Encounter (Signed)
Patient advised.

## 2011-12-08 NOTE — Telephone Encounter (Signed)
Patient called and wanted to know what else she could try OTC instead of Mucinex D.  She has a productive cough with yellow mucus, head congestion.  no fever, no body aches. Please advise.

## 2011-12-08 NOTE — Telephone Encounter (Signed)
i personally like the theraflu brand line of products dayquil and nyquil are good, too  None are magic or perfect, they just make you feel a little better while your body heals

## 2012-01-02 ENCOUNTER — Other Ambulatory Visit: Payer: Self-pay | Admitting: Family Medicine

## 2012-03-10 ENCOUNTER — Other Ambulatory Visit: Payer: Self-pay | Admitting: *Deleted

## 2012-03-10 MED ORDER — ALPRAZOLAM 0.25 MG PO TABS: 0.2500 mg | ORAL_TABLET | Freq: Every day | ORAL | Status: DC

## 2012-03-10 NOTE — Telephone Encounter (Signed)
Ok to refill.  #30, 1 refill 

## 2012-03-10 NOTE — Telephone Encounter (Signed)
rx called to pharmacy 

## 2012-03-10 NOTE — Telephone Encounter (Signed)
Last filled 10-10-2011 last ov 05-29-2011 for uri. Okay to fill?

## 2012-03-29 ENCOUNTER — Encounter: Payer: Self-pay | Admitting: Family Medicine

## 2012-03-29 ENCOUNTER — Ambulatory Visit (INDEPENDENT_AMBULATORY_CARE_PROVIDER_SITE_OTHER): Payer: 59 | Admitting: Family Medicine

## 2012-03-29 VITALS — BP 100/68 | HR 77 | Temp 98.5°F | Ht 64.0 in | Wt 132.8 lb

## 2012-03-29 DIAGNOSIS — Z0000 Encounter for general adult medical examination without abnormal findings: Secondary | ICD-10-CM

## 2012-03-29 DIAGNOSIS — R5383 Other fatigue: Secondary | ICD-10-CM

## 2012-03-29 DIAGNOSIS — R5381 Other malaise: Secondary | ICD-10-CM

## 2012-03-29 NOTE — Progress Notes (Signed)
  Patient Name: Nichole Hogan Date of Birth: 1961-09-07 Age: 51 y.o. Medical Record Number: [id number] Gender: female Date of Encounter: 03/29/2012  History of Present Illness:  Nichole Hogan is a 51 y.o. very pleasant female patient who presents with the following:  Labs mammo - will call directly. Pap/Breast (GYN) - [facility] - Dr. Henrene Hawking in [place] did one. (~2008-9) Smoking  Health Maintenance Summary Reviewed and updated, unless pt declines services.  Tobacco History Reviewed. Non-smoker Alcohol: No concerns, no excessive use Exercise Habits: rare activity STD concerns: none Lumps or breast concerns: no  Health Maintenance  Topic Date Due  . Mammogram  08/03/2011  . Colonoscopy  08/03/2011  . Pap Smear  02/20/2012  . Influenza Vaccine  09/07/2012  . Tetanus/tdap  12/08/2016   Mood doing ok Better with stress at home   Past Medical History, Surgical History, Social History, Family History, Problem List, Medications, and Allergies have been reviewed and updated if relevant.  Review of Systems:  General: Denies fever, chills, sweats. No significant weight loss. Eyes: Denies blurring,significant itching ENT: Denies earache, sore throat, and hoarseness.  Cardiovascular: Denies chest pains, palpitations, dyspnea on exertion,  Respiratory: Denies cough, dyspnea at rest,wheeezing Breast: no concerns about lumps GI: Denies nausea, vomiting, diarrhea, constipation, change in bowel habits, abdominal pain, melena, hematochezia. Some bloating. GU: Denies dysuria, hematuria, urinary hesitancy, nocturia, denies STD risk, no concerns about discharge Musculoskeletal: Denies back pain, joint pain Derm: Denies rash, itching Neuro: Denies  paresthesias, frequent falls, frequent headaches Psych: Denies depression, anxiety Endocrine: Denies cold intolerance, heat intolerance, polydipsia Heme: Denies enlarged lymph nodes Allergy: No hayfever  Physical Examination: Filed Vitals:     03/29/12 1435  BP: 100/68  Pulse: 77  Temp: 98.5 F (36.9 C)  TempSrc: Oral  Height: 5\' 4"  (1.626 m)  Weight: 132 lb 12.8 oz (60.238 kg)  SpO2: 97%    Body mass index is 22.80 kg/(m^2).   GEN: well developed, well nourished, no acute distress Eyes: conjunctiva and lids normal, PERRLA, EOMI ENT: TM clear, nares clear, oral exam WNL Neck: supple, no lymphadenopathy, no thyromegaly, no JVD Pulm: clear to auscultation and percussion, respiratory effort normal CV: regular rate and rhythm, S1-S2, no murmur, rub or gallop, no bruits Chest: no scars, masses, no lumps BREAST: breast exam defer GI: soft, non-tender; no hepatosplenomegaly, masses; active bowel sounds all quadrants GU: GU exam declined Lymph: no cervical, axillary or inguinal adenopathy MSK: gait normal, muscle tone and strength WNL, no joint swelling, effusions, discoloration, crepitus  SKIN: clear, good turgor, color WNL, no rashes, lesions, or ulcerations Neuro: normal mental status, normal strength, sensation, and motion Psych: alert; oriented to person, place and time, normally interactive and not anxious or depressed in appearance.    Assessment and Plan: 1. Routine general medical examination at a health care facility  Basic metabolic panel, CBC with Differential, LDL cholesterol, direct, Hepatic function panel, TSH  2. Fatigue  Basic metabolic panel, CBC with Differential, Hepatic function panel, TSH    The patient's preventative maintenance and recommended screening tests for an annual wellness exam were reviewed in full today. Brought up to date unless services declined.  Counselled on the importance of diet, exercise, and its role in overall health and mortality. The patient's FH and SH was reviewed, including their home life, tobacco status, and drug and alcohol status.   Work on tobacco  F/u with [facility] and Dr. Arvella Merles  incr fiber and water for ibs

## 2012-03-30 LAB — CBC WITH DIFFERENTIAL/PLATELET
Basophils Absolute: 0 K/uL (ref 0.0–0.1)
Basophils Relative: 0.4 % (ref 0.0–3.0)
Eosinophils Absolute: 0.4 K/uL (ref 0.0–0.7)
Eosinophils Relative: 4.7 % (ref 0.0–5.0)
HCT: 45.3 % (ref 36.0–46.0)
Hemoglobin: 15.4 g/dL — ABNORMAL HIGH (ref 12.0–15.0)
Lymphocytes Relative: 27.4 % (ref 12.0–46.0)
Lymphs Abs: 2.5 K/uL (ref 0.7–4.0)
MCHC: 33.9 g/dL (ref 30.0–36.0)
MCV: 91.2 fl (ref 78.0–100.0)
Monocytes Absolute: 0.4 K/uL (ref 0.1–1.0)
Monocytes Relative: 4.3 % (ref 3.0–12.0)
Neutro Abs: 5.8 K/uL (ref 1.4–7.7)
Neutrophils Relative %: 63.2 % (ref 43.0–77.0)
Platelets: 201 K/uL (ref 150.0–400.0)
RBC: 4.97 Mil/uL (ref 3.87–5.11)
RDW: 13.5 % (ref 11.5–14.6)
WBC: 9.1 K/uL (ref 4.5–10.5)

## 2012-03-30 LAB — LDL CHOLESTEROL, DIRECT: Direct LDL: 128.5 mg/dL

## 2012-03-30 LAB — BASIC METABOLIC PANEL
CO2: 25 mEq/L (ref 19–32)
Calcium: 10 mg/dL (ref 8.4–10.5)
Chloride: 104 mEq/L (ref 96–112)
Glucose, Bld: 78 mg/dL (ref 70–99)
Potassium: 4 mEq/L (ref 3.5–5.1)
Sodium: 139 mEq/L (ref 135–145)

## 2012-03-30 LAB — TSH: TSH: 1.21 u[IU]/mL (ref 0.35–5.50)

## 2012-03-30 LAB — HEPATIC FUNCTION PANEL
ALT: 17 U/L (ref 0–35)
Albumin: 4.8 g/dL (ref 3.5–5.2)
Bilirubin, Direct: 0.1 mg/dL (ref 0.0–0.3)
Total Protein: 7.3 g/dL (ref 6.0–8.3)

## 2012-03-31 ENCOUNTER — Encounter: Payer: Self-pay | Admitting: *Deleted

## 2012-05-05 ENCOUNTER — Other Ambulatory Visit: Payer: Self-pay | Admitting: *Deleted

## 2012-05-05 MED ORDER — ALPRAZOLAM 0.25 MG PO TABS: 0.2500 mg | ORAL_TABLET | Freq: Every day | ORAL | Status: DC

## 2012-05-05 NOTE — Telephone Encounter (Signed)
Medication phoned to [facility] as instructed.  

## 2012-05-05 NOTE — Telephone Encounter (Signed)
Ok to refill.  #30, 1 refill 

## 2012-05-05 NOTE — Telephone Encounter (Signed)
Faxed refill request from [facility] for alprazolam .25 mg's.  No last filled date given.

## 2012-06-22 ENCOUNTER — Emergency Department: Payer: Self-pay | Admitting: Emergency Medicine

## 2012-06-23 ENCOUNTER — Encounter: Payer: Self-pay | Admitting: Family Medicine

## 2012-06-23 ENCOUNTER — Ambulatory Visit (INDEPENDENT_AMBULATORY_CARE_PROVIDER_SITE_OTHER): Payer: 59 | Admitting: Family Medicine

## 2012-06-23 VITALS — BP 120/72 | HR 75 | Temp 98.8°F | Ht 64.0 in | Wt 126.2 lb

## 2012-06-23 DIAGNOSIS — R590 Localized enlarged lymph nodes: Secondary | ICD-10-CM | POA: Insufficient documentation

## 2012-06-23 DIAGNOSIS — R599 Enlarged lymph nodes, unspecified: Secondary | ICD-10-CM

## 2012-06-23 MED ORDER — DOXYCYCLINE HYCLATE 100 MG PO TABS: 100.0000 mg | ORAL_TABLET | Freq: Two times a day (BID) | ORAL | Status: DC

## 2012-06-23 MED ORDER — DOXYCYCLINE HYCLATE 100 MG PO TABS: 100.0000 mg | ORAL_TABLET | Freq: Two times a day (BID) | ORAL | Status: AC

## 2012-06-23 NOTE — Patient Instructions (Signed)
Complete antibiotic course.  Follow up with Dr. B in 2weeks if not resolved.

## 2012-06-23 NOTE — Progress Notes (Signed)
  Subjective:    Patient ID: Nichole Hogan, female    DOB: 08-18-1961, 51 y.o.   MRN: [id number]  HPI 51 year old female patient  Presents with new onset knot on right jaw line. Noted first in last 24 hours.  Area was more enlarged last tight... Had noted difficulty swallowing on that side when eating. Some tingling under tounge.  In last couple of weeks noted  Stinging pain in right neck radiating to right arm.  headache this AM.  Some congestion, mild, no sore throat.  She is a smoker, no oral  Lesions. No fever.   Review of Systems  Constitutional: Negative for fever, activity change, appetite change, fatigue and unexpected weight change.  HENT: Negative for ear pain.   Eyes: Negative for pain.  Respiratory: Negative for chest tightness and shortness of breath.   Cardiovascular: Negative for chest pain, palpitations and leg swelling.  Gastrointestinal: Negative for abdominal pain.  Genitourinary: Negative for dysuria.       Objective:   Physical Exam  Constitutional: Vital signs are normal. She appears well-developed and well-nourished. She is cooperative.  Non-toxic appearance. She does not appear ill. No distress.  HENT:  Head: Normocephalic.  Right Ear: Hearing, tympanic membrane, external ear and ear canal normal. Tympanic membrane is not erythematous, not retracted and not bulging.  Left Ear: Hearing, tympanic membrane, external ear and ear canal normal. Tympanic membrane is not erythematous, not retracted and not bulging.  Nose: Mucosal edema and rhinorrhea present. Right sinus exhibits no maxillary sinus tenderness and no frontal sinus tenderness. Left sinus exhibits no maxillary sinus tenderness and no frontal sinus tenderness.  Mouth/Throat: Uvula is midline, oropharynx is clear and moist and mucous membranes are normal.  Eyes: Conjunctivae, EOM and lids are normal. Pupils are equal, round, and reactive to light. No foreign bodies found.  Neck: Trachea normal and normal  range of motion. Neck supple. Carotid bruit is not present. No mass and no thyromegaly present.  Cardiovascular: Normal rate, regular rhythm, S1 normal, S2 normal, normal heart sounds, intact distal pulses and normal pulses.  Exam reveals no gallop and no friction rub.   No murmur heard. Pulmonary/Chest: Effort normal and breath sounds normal. Not tachypneic. No respiratory distress. She has no decreased breath sounds. She has no wheezes. She has no rhonchi. She has no rales.  Lymphadenopathy:       Head (right side): No submental and no submandibular adenopathy present.       Head (left side): No submental and no submandibular adenopathy present.    She has cervical adenopathy.       Right cervical: Superficial cervical adenopathy present. No deep cervical and no posterior cervical adenopathy present.      Left cervical: No superficial cervical, no deep cervical and no posterior cervical adenopathy present.  Neurological: She is alert.  Skin: Skin is warm, dry and intact. No rash noted.  Psychiatric: Her speech is normal and behavior is normal. Judgment normal. Her mood appears not anxious. Cognition and memory are normal. She does not exhibit a depressed mood.          Assessment & Plan:

## 2012-06-23 NOTE — Assessment & Plan Note (Signed)
Possibly due to viral infection, but given unilateral, some concern for bacterial infection.  Will treat with antibiotics. Follow up if not resolving as expected.

## 2012-09-03 ENCOUNTER — Other Ambulatory Visit: Payer: Self-pay | Admitting: Family Medicine

## 2012-09-03 NOTE — Telephone Encounter (Signed)
Electronic refill request.  Please advise. 

## 2012-09-05 NOTE — Telephone Encounter (Signed)
Ok to refill.  #30, 1 refill 

## 2012-09-06 NOTE — Telephone Encounter (Signed)
Medication phoned to [facility] as instructed.  

## 2012-10-22 ENCOUNTER — Other Ambulatory Visit: Payer: Self-pay | Admitting: Family Medicine

## 2012-11-01 ENCOUNTER — Other Ambulatory Visit: Payer: Self-pay | Admitting: Family Medicine

## 2012-11-01 NOTE — Telephone Encounter (Signed)
Ok to refill #30, 3 refills

## 2012-11-01 NOTE — Telephone Encounter (Signed)
rx called to pharmacy 

## 2012-11-21 ENCOUNTER — Other Ambulatory Visit: Payer: Self-pay | Admitting: Family Medicine

## 2012-12-02 ENCOUNTER — Other Ambulatory Visit: Payer: Self-pay | Admitting: Family Medicine

## 2012-12-22 ENCOUNTER — Encounter: Payer: Self-pay | Admitting: Family Medicine

## 2012-12-22 ENCOUNTER — Ambulatory Visit (INDEPENDENT_AMBULATORY_CARE_PROVIDER_SITE_OTHER): Payer: 59 | Admitting: Family Medicine

## 2012-12-22 ENCOUNTER — Ambulatory Visit: Payer: 59 | Admitting: Family Medicine

## 2012-12-22 VITALS — BP 120/72 | HR 88 | Temp 98.4°F | Ht 64.0 in | Wt 128.8 lb

## 2012-12-22 DIAGNOSIS — M19071 Primary osteoarthritis, right ankle and foot: Secondary | ICD-10-CM

## 2012-12-22 DIAGNOSIS — M2020 Hallux rigidus, unspecified foot: Secondary | ICD-10-CM

## 2012-12-22 DIAGNOSIS — M2021 Hallux rigidus, right foot: Secondary | ICD-10-CM

## 2012-12-22 DIAGNOSIS — M79673 Pain in unspecified foot: Secondary | ICD-10-CM

## 2012-12-22 DIAGNOSIS — M79609 Pain in unspecified limb: Secondary | ICD-10-CM

## 2012-12-22 DIAGNOSIS — M19079 Primary osteoarthritis, unspecified ankle and foot: Secondary | ICD-10-CM
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
## 2012-12-22 NOTE — Patient Instructions (Addendum)
HAPAD  [email or web address]

## 2013-01-03 ENCOUNTER — Ambulatory Visit: Payer: 59 | Admitting: Family Medicine

## 2013-01-03 ENCOUNTER — Encounter: Payer: Self-pay | Admitting: Family Medicine

## 2013-01-03 ENCOUNTER — Ambulatory Visit (INDEPENDENT_AMBULATORY_CARE_PROVIDER_SITE_OTHER): Payer: 59 | Admitting: Family Medicine

## 2013-01-03 VITALS — BP 130/74 | HR 86 | Temp 98.1°F | Wt 127.5 lb

## 2013-01-03 DIAGNOSIS — F17200 Nicotine dependence, unspecified, uncomplicated: Secondary | ICD-10-CM

## 2013-01-03 DIAGNOSIS — R590 Localized enlarged lymph nodes: Secondary | ICD-10-CM | POA: Insufficient documentation

## 2013-01-03 DIAGNOSIS — R599 Enlarged lymph nodes, unspecified: Secondary | ICD-10-CM

## 2013-01-03 DIAGNOSIS — J40 Bronchitis, not specified as acute or chronic: Secondary | ICD-10-CM

## 2013-01-03 MED ORDER — DOXYCYCLINE HYCLATE 100 MG PO CAPS: 100.0000 mg | ORAL_CAPSULE | Freq: Two times a day (BID) | ORAL | Status: DC

## 2013-01-03 NOTE — Patient Instructions (Signed)
I recommend yearly flu shot.  Talk with Dr. Patsy Lager about pneumonia shot. You need to quit smoking!  May try nicotrol inhaler and look into chantix (booklet provided, could price out at pharmacy).  Let us know if you're interested. I do think you have bronchitis. Treat with doxycycline twice daily for 10 days. You do have swollen glands on right side of neck.  Please return in 1 month to recheck those glands. Good to see you today, call us with questions.

## 2013-01-03 NOTE — Assessment & Plan Note (Addendum)
Given severe COPD hx, treat aggressively with doxycycline.  Would meet criteria for COPD flare, however no wheezing appreciated on exam today.  Good O2 sats. Update [place] if sxs persist or worsen. Pt declines cough syrup today. discussed importance of smoking cessation.
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
## 2013-01-03 NOTE — Assessment & Plan Note (Signed)
Anticipate bronchitis related, given extensive smoking hx, recommended return in 1 mo to recheck. Pt agrees with plan.

## 2013-01-03 NOTE — Assessment & Plan Note (Signed)
Discussed smoking cessation importance and discussed different methods to help quit. Provided with nicotrol sample and chantix booklet.

## 2013-02-02 ENCOUNTER — Ambulatory Visit: Payer: 59 | Admitting: Family Medicine

## 2013-02-28 ENCOUNTER — Other Ambulatory Visit: Payer: Self-pay

## 2013-02-28 MED ORDER — ALPRAZOLAM 0.25 MG PO TABS: ORAL_TABLET | ORAL | Status: DC

## 2013-02-28 NOTE — Telephone Encounter (Signed)
rx called to pharmacy 

## 2013-02-28 NOTE — Telephone Encounter (Signed)
[facility] faxed refill alprazolam. Last filled 01/25/13.Please advise.

## 2013-02-28 NOTE — Telephone Encounter (Signed)
Ok to refill #30, 3 refills

## 2013-06-29 ENCOUNTER — Other Ambulatory Visit: Payer: Self-pay | Admitting: *Deleted

## 2013-06-29 MED ORDER — ALPRAZOLAM 0.25 MG PO TABS: ORAL_TABLET | ORAL | Status: DC

## 2013-06-29 NOTE — Telephone Encounter (Signed)
Ok to refill #30, 3 refills

## 2013-06-29 NOTE — Telephone Encounter (Signed)
rx called to pharmacy 

## 2013-06-29 NOTE — Telephone Encounter (Signed)
Last filled 05/27/13 

## 2013-10-20 ENCOUNTER — Other Ambulatory Visit: Payer: Self-pay | Admitting: Family Medicine

## 2013-10-20 NOTE — Telephone Encounter (Signed)
Called to [facility]. 

## 2013-10-20 NOTE — Telephone Encounter (Signed)
Last office visit 01/03/2013 with Dr. Gutierrez.  Ok to refill? 

## 2013-10-20 NOTE — Telephone Encounter (Signed)
Ok, 30, 3 refills

## 2013-12-30 ENCOUNTER — Other Ambulatory Visit: Payer: Self-pay | Admitting: Family Medicine

## 2013-12-30 NOTE — Telephone Encounter (Signed)
Last office visit 01/03/2013 with Dr. Danise Mina.  Ok to refill?

## 2014-02-06 ENCOUNTER — Telehealth: Payer: Self-pay

## 2014-02-06 DIAGNOSIS — M19071 Primary osteoarthritis, right ankle and foot: Secondary | ICD-10-CM

## 2014-02-06 DIAGNOSIS — M2021 Hallux rigidus, right foot: Secondary | ICD-10-CM

## 2014-02-06 DIAGNOSIS — M25579 Pain in unspecified ankle and joints of unspecified foot: Secondary | ICD-10-CM

## 2014-02-06 NOTE — Telephone Encounter (Addendum)
Pt left v/m;pt has seen Dr Lorelei Pont previously( approx 1 year ago) with arthritic cyst in rt toe and cyst is growing; pt request cb. Left v/m at all contact #s requesting cb. Pt called back and pt said cyst on toe began to get larger past 2 months and now cyst is size of a quarter. Pain level now is 7 when weight bearing; padding in shoe is not helping. Pt wants to know if should see Dr Lorelei Pont or get referral. Pt request cb.

## 2014-02-06 NOTE — Telephone Encounter (Signed)
Spoke with Lattie Haw.  Ok with being referred to Foot Doctor.  Advised Rosaria Ferries or Vaughan Basta would call her once they get that appointment scheduled for her.

## 2014-02-06 NOTE — Telephone Encounter (Signed)
I remember - it might be a good idea to get someone else's ideas, too.   I would make a referral to one of the foot doctors if that is ok

## 2014-02-16 ENCOUNTER — Ambulatory Visit: Payer: Self-pay | Admitting: Podiatrist

## 2014-02-23 ENCOUNTER — Other Ambulatory Visit: Payer: Self-pay | Admitting: Family Medicine

## 2014-02-23 ENCOUNTER — Encounter: Payer: Self-pay | Admitting: Podiatrist

## 2014-02-23 ENCOUNTER — Ambulatory Visit (INDEPENDENT_AMBULATORY_CARE_PROVIDER_SITE_OTHER): Payer: 59

## 2014-02-23 ENCOUNTER — Ambulatory Visit (INDEPENDENT_AMBULATORY_CARE_PROVIDER_SITE_OTHER): Payer: 59 | Admitting: Podiatrist

## 2014-02-23 VITALS — BP 110/63 | HR 73 | Resp 16 | Ht 64.0 in | Wt 130.0 lb

## 2014-02-23 DIAGNOSIS — M25879 Other specified joint disorders, unspecified ankle and foot: Secondary | ICD-10-CM

## 2014-02-23 DIAGNOSIS — M79609 Pain in unspecified limb: Secondary | ICD-10-CM

## 2014-02-23 DIAGNOSIS — M79673 Pain in unspecified foot: Secondary | ICD-10-CM

## 2014-02-23 DIAGNOSIS — M779 Enthesopathy, unspecified: Secondary | ICD-10-CM

## 2014-02-23 DIAGNOSIS — M898X9 Other specified disorders of bone, unspecified site: Secondary | ICD-10-CM

## 2014-02-23 NOTE — Telephone Encounter (Signed)
Electronic refill request, please advise  

## 2014-02-23 NOTE — Progress Notes (Signed)
   Subjective:    Patient ID: Nichole Hogan, female    DOB: January 31, 1961, 53 y.o.   MRN: [id number]   Subjective: Patient presents today as a referral courtesy of Dr. Donella Stade M.D. The patient states that she's had pain in the great toe at the interphalangeal joint of the right foot for on and off for the past year. She has tried padding which has helped some but has not completely relieved her symptoms. Dr. Edilia Bo suggested she may have a cyst and referred her to be seen.  HPI Comments: N pain L great toe right foot D 1 yr O ? C worse A standing, walking, shoes T pads in shoes,   Foot Pain      Review of Systems  All other systems reviewed and are negative.       Objective:   Physical Exam GENERAL APPEARANCE: Alert, conversant. Appropriately groomed. No acute distress.  VASCULAR: Pedal pulses palpable at 2/4 DP and PT bilateral.  Capillary refill time is immediate to all digits,  Proximal to distal cooling it warm to warm.  Digital hair growth is present bilateral  NEUROLOGIC: sensation is intact epicritically and protectively to 5.07 monofilament at 5/5 sites bilateral.  Light touch is intact bilateral, vibratory sensation intact bilateral, achilles tendon reflex is intact bilateral.  MUSCULOSKELETAL: Range of motion at the first metatarsophalangeal joint right is decreased with hallux limitus present. She also has discomfort with range of motion at the hallux interphalangeal joint of the right foot as well and swelling present compared to the left. DERMATOLOGIC: Swelling at the hallux interphalangeal joint right is present with a soft spongy palpable area at the dorsal medial aspect of the interphalangeal joint which could represent a cyst present.  X-rays show hallux limitus and a bony exostosis present at the right great toe. Elevated first metatarsal is also present.     Assessment & Plan:  Assessment: Hallux limitus right first metatarsophalangeal joint, cyst right  hallux interphalangeal joint, exostosis hallux interphalangeal joint.   Plan: At today's visit I recommended a steroid injection into the area of the hallux interphalangeal joint in an attempt to shrink the cyst and help with discomfort in the region. The patient agreed and a sterile prep was performed. Kenalog and Marcaine mixture was infiltrated into the area of maximal tenderness. The patient tolerated this well. I will see her back in 3 weeks and if there is no improvement we'll consider an exostectomy with a consideration of shortening first metatarsal osteotomy or implant  to increase the range of motion at the first metatarsophalangeal joint to decrease the pressure being placed on the interphalangeal joint of the right great toe.

## 2014-02-23 NOTE — Telephone Encounter (Signed)
Ok to refill #30, 3 refills

## 2014-02-24 NOTE — Telephone Encounter (Signed)
Rx called in as prescribed 

## 2014-03-13 ENCOUNTER — Ambulatory Visit: Payer: Self-pay | Admitting: Obstetrics and Gynecology

## 2014-03-16 ENCOUNTER — Ambulatory Visit: Payer: 59 | Admitting: Podiatrist

## 2014-03-22 ENCOUNTER — Ambulatory Visit: Payer: Self-pay | Admitting: Obstetrics and Gynecology

## 2014-04-26 ENCOUNTER — Encounter: Payer: Self-pay | Admitting: Radiology

## 2014-04-26 ENCOUNTER — Ambulatory Visit (INDEPENDENT_AMBULATORY_CARE_PROVIDER_SITE_OTHER)
Admission: RE | Admit: 2014-04-26 | Discharge: 2014-04-26 | Disposition: A | Discharge status: Home / Self Care | Payer: 59 | Source: Ambulatory Visit | Attending: Family Medicine | Admitting: Family Medicine

## 2014-04-26 ENCOUNTER — Ambulatory Visit (INDEPENDENT_AMBULATORY_CARE_PROVIDER_SITE_OTHER): Payer: 59 | Admitting: Family Medicine

## 2014-04-26 ENCOUNTER — Encounter: Payer: Self-pay | Admitting: Family Medicine

## 2014-04-26 VITALS — BP 120/70 | HR 70 | Temp 98.3°F | Ht 64.0 in | Wt 130.5 lb

## 2014-04-26 DIAGNOSIS — M25579 Pain in unspecified ankle and joints of unspecified foot: Secondary | ICD-10-CM

## 2014-04-26 DIAGNOSIS — M25571 Pain in right ankle and joints of right foot: Secondary | ICD-10-CM

## 2014-04-26 DIAGNOSIS — M259 Joint disorder, unspecified: Secondary | ICD-10-CM

## 2014-04-26 DIAGNOSIS — R29898 Other symptoms and signs involving the musculoskeletal system: Secondary | ICD-10-CM

## 2014-04-26 MED ORDER — PREDNISONE 20 MG PO TABS: ORAL_TABLET | ORAL | Status: DC
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
## 2014-04-26 NOTE — Progress Notes (Signed)
Pre visit review using our clinic review tool, if applicable. No additional management support is needed unless otherwise documented below in the visit note. 

## 2014-05-16 ENCOUNTER — Telehealth: Payer: Self-pay

## 2014-05-16 NOTE — Telephone Encounter (Signed)
Pt was seen on 04/26/14; pt finished prednisone with improvement to rt ankle pain; but when finished prednisone more rt ankle pain; pt scheduled appt on 05/19/14 at 11:30 am with Dr Lorelei Pont. Pt will cb if condition changes or worsens prior to appt. (this is first appt pt could take due to work schedule).

## 2014-05-17 ENCOUNTER — Encounter: Payer: Self-pay | Admitting: Family Medicine

## 2014-05-19 ENCOUNTER — Ambulatory Visit (INDEPENDENT_AMBULATORY_CARE_PROVIDER_SITE_OTHER): Payer: 59 | Admitting: Family Medicine

## 2014-05-19 ENCOUNTER — Encounter: Payer: Self-pay | Admitting: Family Medicine

## 2014-05-19 VITALS — BP 90/50 | HR 71 | Temp 98.2°F | Ht 64.0 in | Wt 132.5 lb

## 2014-05-19 DIAGNOSIS — M25571 Pain in right ankle and joints of right foot: Secondary | ICD-10-CM

## 2014-05-19 DIAGNOSIS — Z8261 Family history of arthritis: Secondary | ICD-10-CM

## 2014-05-19 DIAGNOSIS — M25579 Pain in unspecified ankle and joints of unspecified foot: Secondary | ICD-10-CM

## 2014-05-19 DIAGNOSIS — R5383 Other fatigue: Secondary | ICD-10-CM

## 2014-05-19 DIAGNOSIS — R5381 Other malaise: Secondary | ICD-10-CM

## 2014-05-19 DIAGNOSIS — Z8269 Family history of other diseases of the musculoskeletal system and connective tissue: Secondary | ICD-10-CM

## 2014-05-19 DIAGNOSIS — Z840 Family history of diseases of the skin and subcutaneous tissue: Secondary | ICD-10-CM

## 2014-05-19 DIAGNOSIS — M2550 Pain in unspecified joint: Secondary | ICD-10-CM

## 2014-05-19 LAB — HIGH SENSITIVITY CRP: CRP, High Sensitivity: 2.86 mg/L (ref 0.000–5.000)

## 2014-05-19 LAB — SEDIMENTATION RATE: Sed Rate: 11 mm/hr (ref 0–22)

## 2014-05-19 MED ORDER — PANTOPRAZOLE SODIUM 40 MG PO TBEC: 40.0000 mg | DELAYED_RELEASE_TABLET | Freq: Every day | ORAL | Status: DC

## 2014-05-19 MED ORDER — MELOXICAM 15 MG PO TABS: 15.0000 mg | ORAL_TABLET | Freq: Every day | ORAL | Status: DC

## 2014-05-19 NOTE — Patient Instructions (Signed)
Excellent Over the Counter Orthotics:  www.pedifix.com and "[place]" website: multiple good foot products  SHOES: Danskos, Merrells, Keens, Clarks, Birkenstocks - good arch support, want minimal bendability. Finn Comfort also excellent, but even more expensive.  Tennis Shoes / Running Shoes: Many good companies and styles. The most important thing is to get a good fit and wear a shoe that feels good in the store. Walk around in the store. Run if that is something that you can do. Some of the running stores have a treadmill, also.  Brooks, New Balance, Karleen Hampshire are good, but the most important thing is to wear a shoe that fits your foot and feels good.  [employer] in [place]: excellent staff, IT consultant (Running and [employer]), OTC orthotics. On [employer] across from the [employer]. For running shoe and athletic shoe fit, they are the best.  Nicer shoes:  Birkenstock shoes, [place]: Excellent selection [employer], [place], [place]: They have the largest selection of comfort and supportive shoes that I have ever seen, and their staff is generally quite good in fitting you for shoes. (They will intermittently have sales, mail coupons, and you can look on their website.)

## 2014-05-19 NOTE — Progress Notes (Signed)
[place] [address] Phone: [phone] Fax: [phone]  Patient ID: Nichole Hogan MRN: [id number], DOB: 1961/01/21, 53 y.o. Date of Encounter: 05/19/2014  Primary Physician:  Owens Loffler, MD   Chief Complaint: Follow-up   Subjective:   History of Present Illness:  Nichole Hogan is a 53 y.o. very pleasant female patient who presents with the following:  The patient presents with ongoing RIGHT ankle pain, and she has relatively extensive bilateral forefoot, MTP, and toe arthritic changes that have been ongoing for many years, which are more extensive than would be anticipated for a 53 year old female. I recently placed her on a short course of some steroids, and her foot and ankle felt quite a bit better on that, but then her symptoms returned relatively promptly.  She also does have a maternal grandmother with RA, and this was debilitating in her case, and she also has an aunt who has systemic lupus.  MGM with RA. Alleve helps a little   Other NSAID. Mobic.   Past Medical History, Surgical History, Social History, Family History, Problem List, Medications, and Allergies have been reviewed and updated if relevant.  Review of Systems:  GEN: No fevers, chills. Nontoxic. Primarily MSK c/o today. MSK: Detailed in the HPI GI: tolerating PO intake without difficulty Neuro: No numbness, parasthesias, or tingling associated. Otherwise the pertinent positives of the ROS are noted above.   Objective:   Physical Examination: BP 90/50  Pulse 71  Temp(Src) 98.2 F (36.8 C) (Oral)  Ht 5\' 4"  (1.626 m)  Wt 132 lb 8 oz (60.102 kg)  BMI 22.73 kg/m2  LMP 11/03/2012   GEN: WDWN, NAD, Non-toxic, Alert & Oriented x 3 HEENT: Atraumatic, Normocephalic.  Ears and Nose: No external deformity. EXTR: No clubbing/cyanosis/edema NEURO: Normal gait.  PSYCH: Normally interactive. Conversant. Not depressed or anxious appearing.  Calm demeanor.   RIGHT foot and ankle:  Patient does have grossly full range of motion at the ankle with some slightly decreased strength of 4+/5 in all directions. Nontender at all for ligaments. Nontender in the midfoot. She has extensive degenerative changes at the toes and some at the MTP joints on both feet. She does have a significantly cavus foot and she has some mild tenderness on her plantar fascia during the mid point. No tenderness at the Achilles, peroneal, or posterior tibialis tendons.  Radiology: Dg Ankle Complete Right  04/26/2014   CLINICAL DATA:  Ankle pain  EXAM: RIGHT ANKLE - COMPLETE 3+ VIEW  COMPARISON:  None.  FINDINGS: No acute fracture.  No dislocation.  IMPRESSION: No acute bony pathology.   Electronically Signed   By: Maryclare Bean M.D.   On: 04/26/2014 17:04    Assessment & Plan:   Ankle pain, right  Polyarthralgia - Plan: Cyclic citrul peptide antibody, IgG, Rheumatoid factor, High sensitivity CRP, Sedimentation rate, ANA, Anti-DNA antibody, double-stranded, Anti-Smith antibody  Other malaise and fatigue - Plan: Cyclic citrul peptide antibody, IgG, Rheumatoid factor, High sensitivity CRP, Sedimentation rate, ANA, Anti-DNA antibody, double-stranded, Anti-Smith antibody  Family history of systemic lupus erythematosus - Plan: Cyclic citrul peptide antibody, IgG, Rheumatoid factor, High sensitivity CRP, Sedimentation rate, ANA, Anti-DNA antibody, double-stranded, Anti-Smith antibody  Family history of rheumatoid arthritis - Plan: Cyclic citrul peptide antibody, IgG, Rheumatoid factor, High sensitivity CRP, Sedimentation rate, ANA, Anti-DNA antibody, double-stranded, Anti-Smith antibody  Rheumatological workup given family history.  Trial of daily anti-inflammatory along with Tylenol. PPI given a history of ulcer in the distant past.  New Prescriptions  MELOXICAM (MOBIC) 15 MG TABLET    Take 1 tablet (15 mg total) by mouth daily.   PANTOPRAZOLE (PROTONIX) 40 MG TABLET    Take 1 tablet (40 mg total) by mouth  daily.   Modified Medications   No medications on file   Orders Placed This Encounter  Procedures  . Cyclic citrul peptide antibody, IgG  . Rheumatoid factor  . High sensitivity CRP  . Sedimentation rate  . ANA  . Anti-DNA antibody, double-stranded  . Anti-Smith antibody   Follow-up: No Follow-up on file. Unless noted above, the patient is to follow-up if symptoms worsen. Red flags were reviewed with the patient.  Signed,  Maud Deed. Taiwana Willison, MD, CAQ Sports Medicine   Discontinued Medications   PREDNISONE (DELTASONE) 20 MG TABLET    2 tabs po daily for 4 days, then 1 tab po daily   Current Medications at Discharge:   Medication List       This list is accurate as of: 05/19/14 11:59 PM.  Always use your most recent med list.               ALPRAZolam 0.25 MG tablet  Commonly known as:  XANAX  TAKE 1 TABLET BY MOUTH EVERY DAY     citalopram 20 MG tablet  Commonly known as:  CELEXA  TAKE 1 TABLEY BY MOUTH DAILY     meloxicam 15 MG tablet  Commonly known as:  MOBIC  Take 1 tablet (15 mg total) by mouth daily.     pantoprazole 40 MG tablet  Commonly known as:  PROTONIX  Take 1 tablet (40 mg total) by mouth daily.

## 2014-05-19 NOTE — Progress Notes (Signed)
Pre visit review using our clinic review tool, if applicable. No additional management support is needed unless otherwise documented below in the visit note. 

## 2014-05-20 ENCOUNTER — Telehealth: Payer: Self-pay | Admitting: Family Medicine

## 2014-05-20 LAB — RHEUMATOID FACTOR: Rhuematoid fact SerPl-aCnc: <10 IU/mL (ref <=14)

## 2014-05-20 NOTE — Telephone Encounter (Signed)
Relevant patient education assigned to patient using Emmi. ° °

## 2014-05-22 ENCOUNTER — Encounter: Payer: Self-pay | Admitting: *Deleted

## 2014-05-22 LAB — ANA: ANA: NEGATIVE

## 2014-05-22 LAB — CYCLIC CITRUL PEPTIDE ANTIBODY, IGG: Cyclic Citrullin Peptide Ab: <2 U/mL (ref 0.0–5.0)

## 2014-05-22 LAB — ANTI-DNA ANTIBODY, DOUBLE-STRANDED: ds DNA Ab: <1 IU/mL

## 2014-05-22 LAB — ANTI-SMITH ANTIBODY: ENA SM AB SER-ACNC: NEGATIVE

## 2014-05-29 ENCOUNTER — Telehealth: Payer: Self-pay

## 2014-05-29 MED ORDER — DICLOFENAC SODIUM 75 MG PO TBEC: 75.0000 mg | DELAYED_RELEASE_TABLET | Freq: Two times a day (BID) | ORAL | Status: DC

## 2014-05-29 NOTE — Telephone Encounter (Signed)
Spoke with Nichole Hogan.  She states she had face and eye swelling, rash on arms and some shortness of breath.  Is currently at work and states her breathing is ok as long as she is in the air condition.  Her last dose of Mobic was yesterday morning.   Advised I would let Dr. Lorelei Pont know about her reaction to the Mobic but in the meantime, if she starts having severe SOB to go to the ER immediately.

## 2014-05-29 NOTE — Telephone Encounter (Signed)
Pt left v/m; pt had reaction to mobic and request different med sent to [facility]. Unable to reach pt for further info about symptoms had when taking mobic.Please advise.

## 2014-05-29 NOTE — Telephone Encounter (Signed)
Left message for Nichole Hogan that prescription for Voltaren 75 mg has been sent to her pharmacy.

## 2014-05-29 NOTE — Telephone Encounter (Signed)
Please get more information.

## 2014-05-29 NOTE — Telephone Encounter (Signed)
Ok, this would be very unlikely to be the entire class of NSAIDS.  Change to   Voltaren 75 mg, 1 po bid, #60, 2 refills

## 2014-06-05 NOTE — Telephone Encounter (Signed)
Pt said pt took voltaren and had face and eye swelling, rash on arms and SOB; same reaction as when took Mobic. Pt also said with both meds pt had vaginal spotting and abdominal bloating. Pt said has not had menstrual period in 6 months. Pt said she stopped med the vaginal spotting stopped.Pt took last Voltaren on 06/02/14. Pt has no allergic symptoms now.[facility]. Pt request cb.

## 2014-06-05 NOTE — Telephone Encounter (Signed)
Lattie Haw notified as instructed per Dr. Lorelei Pont, by telephone

## 2014-06-05 NOTE — Telephone Encounter (Signed)
Stop. If she can take over the counter alleve or ibuprofen, that is also a reasonable alternative.  For the next few days, I would take benadryl 25 mg 4 times a day and do not take any kind of antiinflammatories at all.  After then EITHER:  Alleve 2 tabs by mouth two times a day over the counter: Take at least for 2 - 3 weeks. This is equal to a prescripton strength dose (GENERIC CHEAPER EQUIVALENT IS NAPROXEN SODIUM)   OR  Motrin 600 - 800 mg recommended TID. (Over the counter Motrin, Advil, or Generic Ibuprofen 200 mg tablets. 3-4 tablets by mouth 3 times a day. This equals a prescription strength dose.)

## 2014-07-05 ENCOUNTER — Other Ambulatory Visit: Payer: Self-pay | Admitting: Family Medicine

## 2014-07-05 NOTE — Telephone Encounter (Signed)
Last office visit 05/19/2014.  Last refilled 02/24/2014 for #30 with 3 refills.  Ok to refill?

## 2014-07-05 NOTE — Telephone Encounter (Signed)
Ok to refill 30, 2 ref

## 2014-07-05 NOTE — Telephone Encounter (Signed)
Called to [facility]. 

## 2014-07-24 ENCOUNTER — Telehealth: Payer: Self-pay

## 2014-07-24 NOTE — Telephone Encounter (Signed)
Pt left v/m requesting referral to [facility] for rt foot and ankle pain; pt seen 05/2014 and foot and ankle pain has worsened, by the end of the day pain level 8-10; wearing good support shoes but does not seem to help. Pt request cb.

## 2014-07-24 NOTE — Telephone Encounter (Signed)
Patient notified as instructed by telephone. 

## 2014-07-24 NOTE — Telephone Encounter (Signed)
It looks like she just saw Wilhemina Bonito at [facility] 3 months ago - she can make her direct f/u appt with her.

## 2014-08-03 ENCOUNTER — Ambulatory Visit (INDEPENDENT_AMBULATORY_CARE_PROVIDER_SITE_OTHER): Payer: 59

## 2014-08-03 ENCOUNTER — Ambulatory Visit (INDEPENDENT_AMBULATORY_CARE_PROVIDER_SITE_OTHER): Payer: 59 | Admitting: Podiatry

## 2014-08-03 VITALS — BP 127/67 | HR 67 | Resp 16

## 2014-08-03 DIAGNOSIS — M779 Enthesopathy, unspecified: Secondary | ICD-10-CM

## 2014-08-03 DIAGNOSIS — M79604 Pain in right leg: Secondary | ICD-10-CM

## 2014-08-03 DIAGNOSIS — G5750 Tarsal tunnel syndrome, unspecified lower limb: Secondary | ICD-10-CM

## 2014-08-03 DIAGNOSIS — M79609 Pain in unspecified limb: Secondary | ICD-10-CM

## 2014-08-03 DIAGNOSIS — G5751 Tarsal tunnel syndrome, right lower limb: Secondary | ICD-10-CM

## 2014-08-04 ENCOUNTER — Telehealth: Payer: Self-pay | Admitting: *Deleted

## 2014-08-04 NOTE — Telephone Encounter (Signed)
Called and left voicemail letting pt know appt sch at [facility] clinic building 2nd floor on 9.9.15 at 1:00 with dr Melrose Nakayama for a nerve conduction study.

## 2014-08-04 NOTE — Progress Notes (Signed)
Patient ID: Nichole Hogan, female   DOB: 1961-11-12, 53 y.o.   MRN: [id number]  53 year old female presents the office today with complaints of right ankle and arch pain. Patient states that she has swelling and pain is in her ankles and the arch of her foot. She does state she has an old injury to this ankle years ago. She states that she gets a pain in and stinging sensation on the inside aspect of her ankle that goes into her foot. Worse when weightbearing or at the end of the day. She previously was seen for right first MTPJ hallux limitus with cyst for which she states is doing well and currently has no symptoms in that area. No other complaints at this time.  Objective: AAO x3, NAD DP/PT pulse palpable b/l. CRT < 3sec Decreased ROM of 1st MTPJ on the right. Tenderness over the medial aspect of the ankle behind the medial malleolus. There is no pinpoint bony pain in the foot or ankle.  +tinel sign on the right; protective sensation intact b/l the Semmes Weinstein monofilament, vibratory sensation intact b/l, Achilles reflex intact b/l.  ROM WNL, MMT 5/5  Assessment: 53 year old female with possible tarsal tunnel  Plan: -Conservative versus surgical intervention was discussed with the patient in detail including alternatives, risks, complications. -Due to the positive Tinel sign and pain and symptoms consistent with neurological origin will obtain nerve conduction test to evaluate. -Due to the tenderness in the ankle patient placed into a cam boot for immobilization this time. (Discussed signs and symptoms of DVT and directed to the emergency room if any are to occur) -Followup after the results of the nerve conduction test. -If symptoms persists we'll obtain MRI.

## 2014-08-09 ENCOUNTER — Ambulatory Visit: Payer: Self-pay | Admitting: Podiatry

## 2014-08-24 ENCOUNTER — Telehealth: Payer: Self-pay | Admitting: *Deleted

## 2014-08-24 NOTE — Telephone Encounter (Signed)
Nichole Hogan called and said she can not go to have her [facility] testing because she would have to pay for it out of pocket. What is your next step?

## 2014-08-24 NOTE — Telephone Encounter (Signed)
Patient called and stated she left message yesterday , was wanting to know what the status was. She can not afford the the nerve conduction study , what would Dr Jacqualyn Posey like to do next , please call

## 2014-08-24 NOTE — Telephone Encounter (Signed)
Spoke with Korayma and she stated she wanted the mri done. Told pt will contact her when appt is made. Pt understood.

## 2014-08-25 ENCOUNTER — Other Ambulatory Visit: Payer: Self-pay | Admitting: Podiatry

## 2014-08-25 DIAGNOSIS — M779 Enthesopathy, unspecified: Secondary | ICD-10-CM

## 2014-09-05 ENCOUNTER — Encounter: Payer: Self-pay | Admitting: *Deleted

## 2014-09-05 ENCOUNTER — Ambulatory Visit: Payer: 59 | Admitting: Podiatry

## 2014-09-05 NOTE — Progress Notes (Signed)
Called [facility] approval [phone] exp 11.13.15

## 2014-09-06 ENCOUNTER — Ambulatory Visit
Admission: RE | Admit: 2014-09-06 | Discharge: 2014-09-06 | Disposition: A | Discharge status: Home / Self Care | Payer: 59 | Source: Ambulatory Visit | Attending: Podiatry | Admitting: Podiatry

## 2014-09-06 DIAGNOSIS — M779 Enthesopathy, unspecified: Secondary | ICD-10-CM

## 2014-09-14 ENCOUNTER — Ambulatory Visit (INDEPENDENT_AMBULATORY_CARE_PROVIDER_SITE_OTHER): Payer: 59 | Admitting: Podiatry

## 2014-09-14 VITALS — BP 117/63 | HR 77 | Resp 16

## 2014-09-14 DIAGNOSIS — M76829 Posterior tibial tendinitis, unspecified leg: Secondary | ICD-10-CM

## 2014-09-14 DIAGNOSIS — M6789 Other specified disorders of synovium and tendon, multiple sites: Secondary | ICD-10-CM

## 2014-09-14 DIAGNOSIS — S93601D Unspecified sprain of right foot, subsequent encounter: Secondary | ICD-10-CM

## 2014-09-14 NOTE — Progress Notes (Signed)
Patient ID: Nichole Hogan, female   DOB: 09-10-1961, 53 y.o.   MRN: [id number]  Subjective: Patient returns the office they for followup evaluation of right ankle and foot pain. She is also here to get results of her MRI. She was unable to wear the cam boot she states that her job would not her wear it. She states that the pain is about the same as it was before. Denies any recent history of trauma or injury to the area. No acute changes since last appointment. No other complaints at this time.  Objective: AAO x3, NAD DP/PT pulses palpable bilaterally, CRT less than 3 seconds. Protective sensation intact with Derrel Nip monofilament, vibratory sensation intact, Achilles tendon reflex intact. Positive Tinel sign on the right. Decreased range of motion of the first MTPJ on the right. Tenderness to palpation over the medial aspect of the ankle behind the medial malleolus along the course of posterior tibial tendon and along its insertion into the navicular. There is no pinpoint bony tenderness and no pain with vibratory sensation. Able to perform a double arise in a single heel rise although with a single heel rise there is pain along the course of posterior tibial tendon on the right side. Overall foot type with a rectus her foot however the forefoot has an adducted position. MMT 5/5, ROM WNL No calf pain, swelling, warmth. No open lesions.  Assessment: 53 year old female with likely posterior tibial tendon dysfunction, spring ligament sprain; possible tarsal tunnel syndrome.  Plan: -MRI was obtained and reviewed with the patient. -Various options both surgical and conservative or discussed the patient in detail including alternatives, risks, complications. -At this time since she is unable to wear the cam boot at work will  recommend wearing a ankle brace help stabilize the ankle. Also recommended icing to the area. Hold off on anti-inflammatory medication due to gastric ulcer. -Discussed  with her possible tarsal tunnel syndrome and she has a positive Tinel sign however an MRI shows a there is edema within the area. Hopefully with resolution of the inflammation around the area the nerve will recover. Discussed with her that if those symptoms continue surgical goal intervention may be necessary. As also difficult to evaluate the insurance company denied nerve conduction velocity testing. -Followup in 3 weeks or sooner if any problems are to arise or any change in symptoms. In the meantime call the office with any questions, concerns, change in symptoms

## 2014-09-25 ENCOUNTER — Encounter: Payer: Self-pay | Admitting: Family Medicine

## 2014-09-25 ENCOUNTER — Ambulatory Visit (INDEPENDENT_AMBULATORY_CARE_PROVIDER_SITE_OTHER): Payer: 59 | Admitting: Family Medicine

## 2014-09-25 ENCOUNTER — Telehealth: Payer: Self-pay | Admitting: Family Medicine

## 2014-09-25 VITALS — BP 130/80 | HR 70 | Temp 98.1°F | Wt 131.5 lb

## 2014-09-25 DIAGNOSIS — J40 Bronchitis, not specified as acute or chronic: Secondary | ICD-10-CM

## 2014-09-25 MED ORDER — PREDNISONE 20 MG PO TABS: 20.0000 mg | ORAL_TABLET | Freq: Every day | ORAL | Status: DC

## 2014-09-25 MED ORDER — DOXYCYCLINE HYCLATE 100 MG PO TABS: 100.0000 mg | ORAL_TABLET | Freq: Two times a day (BID) | ORAL | Status: DC

## 2014-09-25 MED ORDER — ALBUTEROL SULFATE HFA 108 (90 BASE) MCG/ACT IN AERS: 1.0000 | INHALATION_SPRAY | Freq: Four times a day (QID) | RESPIRATORY_TRACT | Status: DC | PRN

## 2014-09-25 NOTE — Patient Instructions (Signed)
Use the inhaler for a few days.   If not improved, start the prednisone and the doxycycline.  Take care.  Glad to see you.

## 2014-09-25 NOTE — Progress Notes (Signed)
Pre visit review using our clinic review tool, if applicable. No additional management support is needed unless otherwise documented below in the visit note.  Got sick about 1 week ago.  Smoking, not ready to quit.  D/w pt.  ST, nasal congestion.  "Sneezing my head off."  Started taking mucinex D.  Then developed chest sx, more cough.  Some sputum prev.  No fevers.  Some wheeze, more than normal. She isn't better, but isn't much worse either, over the last few days.  Not using inhaler recently, had used in distant past.    Father ill, not sleeping well.   Meds, vitals, and allergies reviewed.   ROS: See HPI.  Otherwise, noncontributory.  GEN: nad, alert and oriented HEENT: mucous membranes moist, tm w/o erythema, nasal exam w/o erythema, clear discharge noted,  OP with cobblestoning NECK: supple w/o LA CV: rrr.   PULM: ctab except for occ scattered faint B rhonchi, no inc wob, no wheeze EXT: no edema SKIN: no acute rash

## 2014-09-25 NOTE — Telephone Encounter (Signed)
Patient Information:  Caller Name: Sharilynn  Phone: [phone]  Patient: Nichole Hogan, Nichole Hogan  Gender: Female  DOB: Feb 01, 1961  Age: 53 Years  PCP: Owens Loffler [facility])  Pregnant: No  Office Follow Up:  Does the office need to follow up with this patient?: No  Instructions For The Office: N/A  RN Note:  Productive cough with clear phlem.  Clear rhinorrhea. Advised to see provider today due to known COPD and worsening symptoms per Cough.  Symptoms  Reason For Call & Symptoms: Productive cough, chest congestion and nasal congestion.  Reviewed Health History In EMR: Yes  Reviewed Medications In EMR: Yes  Reviewed Allergies In EMR: Yes  Reviewed Surgeries / Procedures: Yes  Date of Onset of Symptoms: 09/17/2014  Treatments Tried: Mucinex  Treatments Tried Worked: Yes OB / GYN:  LMP: 08/06/2014  Guideline(s) Used:  Cough  Disposition Per Guideline:   See Today in Office  Reason For Disposition Reached:   Known COPD or other severe lung disease (i.e., bronchiectasis, cystic fibrosis, lung surgery) and worsening symptoms (i.e., increased sputum purulence or amount, increased breathing difficulty)  Advice Given:  Reassurance  Coughing is the way that our lungs remove irritants and mucus. It helps protect our lungs from getting pneumonia.  You can get a dry hacking cough after a chest cold. Sometimes this type of cough can last 1-3 weeks, and be worse at night.  Cough Medicines:  OTC Cough Syrups: The most common cough suppressant in OTC cough medications is dextromethorphan. Often the letters "DM" appear in the name.  OTC Cough Drops: Cough drops can help a lot, especially for mild coughs. They reduce coughing by soothing your irritated throat and removing that tickle sensation in the back of the throat. Cough drops also have the advantage of portability - you can carry them with you.  Home Remedy - Hard Candy: Hard candy works just as well as medicine-flavored OTC cough  drops. Diabetics should use sugar-free candy.  Home Remedy - Honey: This old home remedy has been shown to help decrease coughing at night. The adult dosage is 2 teaspoons (10 ml) at bedtime. Honey should not be given to infants under one year of age.  OTC Cough Syrup - Dextromethorphan:  Cough syrups containing the cough suppressant dextromethorphan (DM) may help decrease your cough. Cough syrups work best for coughs that keep you awake at night. They can also sometimes help in the late stages of a respiratory infection when the cough is dry and hacking. They can be used along with cough drops.  Examples: Benylin, Robitussin DM, Vicks 44 Cough Relief  Caution - Dextromethorphan:   Do not try to completely suppress coughs that produce mucus and phlegm. Remember that coughing is helpful in bringing up mucus from the lungs and preventing pneumonia.  Coughing Spasms:  Drink warm fluids. Inhale warm mist (Reason: both relax the airway and loosen up the phlegm).  Suck on cough drops or hard candy to coat the irritated throat.  Prevent Dehydration:  Drink adequate liquids.  This will help soothe an irritated or dry throat and loosen up the phlegm.  Avoid Tobacco Smoke:  Smoking or being exposed to smoke makes coughs much worse.  Expected Course:   The expected course depends on what is causing the cough.  Viral bronchitis (chest cold) causes a cough that lasts 1 to 3 weeks. Sometimes you may cough up lots of phlegm (sputum, mucus). The mucus can normally be white, gray, yellow, or green.  Call Back If:  Difficulty breathing  Cough lasts more than 3 weeks  Fever lasts > 3 days  You become worse.  Patient Will Follow Care Advice:  YES  Appointment Scheduled:  09/25/2014 14:30:00 Appointment Scheduled Provider:  Elsie Stain Brigitte Pulse) [facility])

## 2014-09-26 ENCOUNTER — Telehealth: Payer: Self-pay

## 2014-09-26 NOTE — Telephone Encounter (Signed)
Left message for Zeta return my call.

## 2014-09-26 NOTE — Telephone Encounter (Signed)
Message copied by Carter Kitten on Tue Sep 26, 2014  8:25 AM ------      Message from: Owens Loffler      Created: Mon Sep 25, 2014  5:21 PM      Regarding: increase dose       Butch Penny, can you let her know it is ok to increase xanax dose to 2 tablets at night time now if she needs it. We can adjust her dose at next refill.            Please convert to phone note            Electronically Signed  By: Owens Loffler, MD On: 09/25/2014 5:22 PM             ----- Message -----         From: Tonia Ghent, MD         Sent: 09/25/2014   3:02 PM           To: Owens Loffler, MD            Patient has her father in/out of the hospital.  Not sleeping well.  I didn't change her meds.  She was asking about inc in xanax for nighttime use.  I'll defer to you,she asked me to pass this along.  She is going to be up for a refill soon.  Thanks.              Brigitte Pulse       ------

## 2014-09-26 NOTE — Telephone Encounter (Signed)
Pt was seen 09/25/14 and albuterol inhaler is tier 3 and too expensive for pt. Pt request substitute [facility].Please advise.

## 2014-09-26 NOTE — Telephone Encounter (Signed)
Let me know if anything else is lower tier so we can substitute.  Thanks.

## 2014-09-27 NOTE — Telephone Encounter (Signed)
Nichole Hogan notified that she can increase her Xanax to two tablets at bedtime if needed per Dr. Lorelei Pont.  I advised her to call her insurance company and find out which inhaler they would cover at a lower tier so we could substitute per Dr. Damita Dunnings.  Patient in agreement with plan.

## 2014-09-27 NOTE — Telephone Encounter (Signed)
Patient returned your call.  Patient asked for you to call her back at [phone].

## 2014-09-27 NOTE — Assessment & Plan Note (Signed)
Likely.  Nontoxic.  D/w pt about options.   Use SABA for a few days.  If not improved, start the prednisone and the doxycycline.  She would like to avoid pred if possible and this is reasonable.   Okay for a short trial w/o abx in meantime. She agrees.   Call back as needed.

## 2014-10-02 ENCOUNTER — Other Ambulatory Visit: Payer: Self-pay | Admitting: Family Medicine

## 2014-10-02 NOTE — Telephone Encounter (Signed)
Last office visit 09/25/2014 with Dr. Damita Dunnings.  Last CPE 03/29/2012.  Ok to refill?

## 2014-10-09 ENCOUNTER — Other Ambulatory Visit: Payer: Self-pay

## 2014-10-09 NOTE — Telephone Encounter (Signed)
Paper Rx request received for xanax. Patient last office visit with PCP was 05/19/14 and medication last refilled 07/05/14 with 2 refills. Please advise

## 2014-10-10 ENCOUNTER — Other Ambulatory Visit: Payer: Self-pay | Admitting: Family Medicine

## 2014-10-10 MED ORDER — ALPRAZOLAM 0.25 MG PO TABS: ORAL_TABLET | ORAL | Status: DC

## 2014-10-10 NOTE — Telephone Encounter (Signed)
Ok to refill 30, 2 ref

## 2014-10-10 NOTE — Telephone Encounter (Signed)
Last office visit 09/25/2014 with Dr. Damita Dunnings.  Last refilled 07/05/2014 #30 with 2 refills.  Ok to refill?

## 2014-10-10 NOTE — Telephone Encounter (Signed)
Called to [facility]. 

## 2014-10-10 NOTE — Telephone Encounter (Signed)
Ok to ref #30, 2 ref 

## 2014-10-12 ENCOUNTER — Ambulatory Visit (INDEPENDENT_AMBULATORY_CARE_PROVIDER_SITE_OTHER): Payer: 59 | Admitting: Podiatry

## 2014-10-12 VITALS — BP 104/58 | HR 76 | Resp 16

## 2014-10-12 DIAGNOSIS — M76829 Posterior tibial tendinitis, unspecified leg: Secondary | ICD-10-CM

## 2014-10-12 DIAGNOSIS — M76821 Posterior tibial tendinitis, right leg: Secondary | ICD-10-CM

## 2014-10-12 DIAGNOSIS — S93601D Unspecified sprain of right foot, subsequent encounter: Secondary | ICD-10-CM

## 2014-10-12 DIAGNOSIS — M79604 Pain in right leg: Secondary | ICD-10-CM

## 2014-10-12 DIAGNOSIS — M6789 Other specified disorders of synovium and tendon, multiple sites: Secondary | ICD-10-CM

## 2014-10-12 NOTE — Patient Instructions (Signed)
Posterior Tibial Tendon Tendinitis with Rehab Tendonitis is a condition that is characterized by inflammation of a tendon or the lining (sheath) that surrounds it. The inflammation is usually caused by damage to the tendon, such as a tendon tear (strain). Sprains are classified into three categories. Grade 1 sprains cause pain, but the tendon is not lengthened. Grade 2 sprains include a lengthened ligament due to the ligament being stretched or partially ruptured. With grade 2 sprains there is still function, although the function may be diminished. Grade 3 sprains are characterized by a complete tear of the tendon or muscle, and function is usually impaired. Posterior tibialis tendonitis is tendonitis of the posterior tibial tendon, which attaches muscles of the lower leg to the foot. The posterior tibial tendon is located in the back of the ankle and helps the body straighten (plantar flex) and rotate inward (medially rotate) the ankle. SYMPTOMS   Pain, tenderness, swelling, warmth, and/or redness over the back of the inner ankle at the posterior tibial tendon or the inner part of the mid-foot.  Pain that worsens with plantar flexion or medial rotation of the ankle.  A crackling sound (crepitation) when the tendon is moved or touched. CAUSES  Posterior tibial tendonitis occurs when damage to the posterior tibial tendon starts an inflammatory response. Common mechanisms of injury include:  Degenerative (occurs with aging) processes that weaken the tendon and make it more susceptible to injury.  Stress placed on the tendon from an increase in the intensity, frequency, or duration of training.  Direct trauma to the ankle.  Returning to activity before a previous ankle injury is allowed to heal. RISK INCREASES WITH:  Activities that involve repetitive and/or stressful plantar flexion (jumping, kicking, or running up/down hills).  Poor strength and flexibility.  Flat feet.  Previous injury  to the foot, ankle, or leg. PREVENTION   Warm up and stretch properly before activity.  Allow for adequate recovery between workouts.  Maintain physical fitness:  Strength, flexibility, and endurance.  Cardiovascular fitness.  Learn and use proper technique. When possible, have a coach correct improper technique.  Complete rehabilitation from a previous foot, ankle, or leg injury.  If you have flat feet, wear arch supports (orthotics). PROGNOSIS  If treated properly, the symptoms of tendonitis usually resolve within 6 weeks. This period may be shorter for injuries caused by direct trauma. RELATED COMPLICATIONS   Prolonged healing time, if improperly treated or reinjured.  Recurrent symptoms that result in a chronic problem.  Partial or complete tendon tear (rupture) requiring surgery. TREATMENT  Treatment initially involves the use of ice and medication to help reduce pain and inflammation. The use of strengthening and stretching exercises may help reduce pain with activity. These exercises may be performed at home or with referral to a therapist. Often times, your caregiver will recommend immobilizing the ankle to allow the tendon to heal. If you have flat feet, you may be advised to wear orthotic arch supports. If symptoms persist for greater than 6 months despite nonsurgical (conservative) treatment, then surgery may be recommended. MEDICATION   If pain medication is necessary, then nonsteroidal anti-inflammatory medications, such as aspirin and ibuprofen, or other minor pain relievers, such as acetaminophen, are often recommended.  Do not take pain medication for 7 days before surgery.  Prescription pain relievers may be given if deemed necessary by your caregiver. Use only as directed and only as much as you need.  Corticosteroid injections may be given by your caregiver. These injections should  be reserved for the most serious cases because they may only be given a certain  number of times. HEAT AND COLD  Cold treatment (icing) relieves pain and reduces inflammation. Cold treatment should be applied for 10 to 15 minutes every 2 to 3 hours for inflammation and pain and immediately after any activity that aggravates your symptoms. Use ice packs or massage the area with a piece of ice (ice massage).  Heat treatment may be used prior to performing the stretching and strengthening activities prescribed by your caregiver, physical therapist, or athletic trainer. Use a heat pack or soak the injury in warm water. SEEK MEDICAL CARE IF:  Treatment seems to offer no benefit, or the condition worsens.  Any medications produce adverse side effects. EXERCISES RANGE OF MOTION (ROM) AND STRETCHING EXERCISES - Posterior Tibial Tendon Tendinitis These exercises may help you when beginning to rehabilitate your injury. Your symptoms may resolve with or without further involvement from your physician, physical therapist or athletic trainer. While completing these exercises, remember:   Restoring tissue flexibility helps normal motion to return to the joints. This allows healthier, less painful movement and activity.  An effective stretch should be held for at least 30 seconds.  A stretch should never be painful. You should only feel a gentle lengthening or release in the stretched tissue. RANGE OF MOTION - Ankle Plantar Flexion   Sit with your right / left leg crossed over your opposite knee.  Use your opposite hand to pull the top of your foot and toes toward you.  You should feel a gentle stretch on the top of your foot/ankle. Hold this position for __________ seconds. Repeat __________ times. Complete this exercise __________ times per day.  RANGE OF MOTION - Ankle Eversion   Sit with your right / left ankle crossed over your opposite knee.  Grip your foot with your opposite hand, placing your thumb on the top of your foot and your fingers across the bottom of your  foot.  Gently push your foot downward with a slight rotation so your littlest toes rise slightly.  You should feel a gentle stretch on the inside of your ankle. Hold the stretch for __________ seconds. Repeat __________ times. Complete this exercise __________ times per day.  RANGE OF MOTION - Ankle Inversion   Sit with your right / left ankle crossed over your opposite knee.  Grip your foot with your opposite hand, placing your thumb on the bottom of your foot and your fingers across the top of your foot.  Gently pull your foot so the smallest toe comes toward you and your thumb pushes the inside of the ball of your foot away from you.  You should feel a gentle stretch on the outside of your ankle. Hold the stretch for __________ seconds. Repeat __________ times. Complete this exercise __________ times per day.  RANGE OF MOTION - Dorsi/Plantar Flexion  While sitting with your right / left knee straight, draw the top of your foot upward by flexing your ankle. Then reverse the motion, pointing your toes downward.  Hold each position for __________ seconds.  After completing your first set of exercises, repeat this exercise with your knee bent. Repeat __________ times. Complete this exercise __________ times per day.  RANGE OF MOTION - Ankle Alphabet  Imagine your right / left big toe is a pen.  Keeping your hip and knee still, write out the entire alphabet with your "pen." Make the letters as large as you can without   increasing any discomfort. Repeat __________ times. Complete this exercise __________ times per day.  STRETCH - Gastrocsoleus   Sit with your right / left leg extended. Holding onto both ends of a belt or towel, loop it around the ball of your foot.  Keeping your right / left ankle and foot relaxed and your knee straight, pull your foot and ankle toward you using the belt/towel.  You should feel a gentle stretch behind your calf or knee. Hold this position for  __________ seconds. Repeat __________ times. Complete this exercise __________ times per day.  STRETCH - Gastroc, Standing   Place hands on wall.  Extend right / left leg, keeping the front knee somewhat bent.  Slightly point your toes inward on your back foot.  Keeping your right / left heel on the floor and your knee straight, shift your weight toward the wall, not allowing your back to arch.  You should feel a gentle stretch in the right / left calf. Hold this position for __________ seconds. Repeat __________ times. Complete this stretch __________ times per day. STRETCH - Soleus, Standing   Place hands on wall.  Extend right / left leg, keeping the other knee somewhat bent.  Slightly point your toes inward on your back foot.  Keep your right / left heel on the floor, bend your back knee, and slightly shift your weight over the back leg so that you feel a gentle stretch deep in your back calf.  Hold this position for __________ seconds. Repeat __________ times. Complete this stretch __________ times per day. STRENGTHENING EXERCISES - Posterior Tibial Tendon Tendinitis These exercises may help you when beginning to rehabilitate your injury. They may resolve your symptoms with or without further involvement from your physician, physical therapist, or athletic trainer. While completing these exercises, remember:   Muscles can gain both the endurance and the strength needed for everyday activities through controlled exercises.  Complete these exercises as instructed by your physician, physical therapist, or athletic trainer. Progress the resistance and repetitions only as guided. STRENGTH - Dorsiflexors  Secure a rubber exercise band/tubing to a fixed object (i.e., table, pole) and loop the other end around your right / left foot.  Sit on the floor facing the fixed object. The band/tubing should be slightly tense when your foot is relaxed.  Slowly draw your foot back toward you  using your ankle and toes.  Hold this position for __________ seconds. Slowly release the tension in the band and return your foot to the starting position. Repeat __________ times. Complete this exercise __________ times per day.  STRENGTH - Towel Curls  Sit in a chair positioned on a non-carpeted surface.  Place your foot on a towel, keeping your heel on the floor.  Pull the towel toward your heel by only curling your toes. Keep your heel on the floor.  If instructed by your physician, physical therapist, or athletic trainer, add ____________________ at the end of the towel. Repeat __________ times. Complete this exercise __________ times per day. STRENGTH - Ankle Eversion   Secure one end of a rubber exercise band/tubing to a fixed object (table, pole). Loop the other end around your foot just before your toes.  Place your fists between your knees. This will focus your strengthening at your ankle.  Drawing the band/tubing across your opposite foot, slowly pull your little toe out and up. Make sure the band/tubing is positioned to resist the entire motion.  Hold this position for __________ seconds.  Have   your muscles resist the band/tubing as it slowly pulls your foot back to the starting position. Repeat __________ times. Complete this exercise __________ times per day.  STRENGTH - Ankle Inversion   Secure one end of a rubber exercise band/tubing to a fixed object (table, pole). Loop the other end around your foot just before your toes.  Place your fists between your knees. This will focus your strengthening at your ankle.  Slowly, pull your big toe up and in, making sure the band/tubing is positioned to resist the entire motion.  Hold this position for __________ seconds.  Have your muscles resist the band/tubing as it slowly pulls your foot back to the starting position. Repeat __________ times. Complete this exercises __________ times per day.  Document Released:  11/24/2005 Document Revised: 04/10/2014 Document Reviewed: 03/08/2009 [employer] Patient Information 2015 [employer]. This information is not intended to replace advice given to you by your health care provider. Make sure you discuss any questions you have with your health care provider.  

## 2014-10-15 NOTE — Progress Notes (Signed)
Patient ID: Nichole Hogan, female   DOB: 1961-12-03, 53 y.o.   MRN: [id number]  Subjective: Nichole Hogan returns the office they for follow-up evaluation of right foot and ankle pain. She states that she has been wearing the brace consistently. She states that she's first puts the brace on or after work when she takes it off she has some discomfort for short period time but after she adjusts deviated having the brace on or off the symptoms resolved. She does state that overall she has decreased symptoms compared to previous appointment. She has also been icing the area. She states the only area of pain that she has at this time is behind the inside aspect of her ankle. She denies any systemic complaints such as fevers, chills, nausea, vomiting. Denies any acute changes since last appointment. No other complaints at this time.  Objective: AAO x3, NAD DP/PT pulses palpable bilaterally, CRT less than 3 seconds Protective sensation intact with Simms Weinstein monofilament, vibratory sensation intact, Achilles tendon reflex intact, negative tinel sign  Mild tenderness to palpation over the medial aspect of the ankle along the course of posterior tibial tendon. There is no pain along the insertion of the posterior tibial tendon at the navicular. There is no overlying edema, erythema or increased warmth. There is no areas of pinpoint bony tenderness or pain with vibratory sensation. MMT 5/5, ROM WNL No calf pain with compression, swelling, warmth, erythema No open lesions.  Assessment: 53 year old female with resolving posterior tibial tendinitis with posterior tibial tendon dysfunction, likely spring ligament sprain  Plan: -Conservative versus surgical options discussed including alternatives, risks, complications. -At this time continue wearing the brace. I did discuss with her various stretching and strengthening exercises to help rehabilitate the tendon. Discussed with her to slowly start his exercises  and if there is any increase in pain to stop. Also continue icing the area. -if at next appointment there continues to be pain without much improvement we'll likely refer the patient to physical therapy. -Follow-up in 4 weeks or sooner if any problems are to arise or any change in symptoms. In the meantime call the office with any questions, concerns.

## 2014-11-09 ENCOUNTER — Telehealth: Payer: Self-pay

## 2014-11-09 NOTE — Telephone Encounter (Signed)
See Phone Note from 09/26/2014.  Ok to change directions and increase quantity to #60.  Please Advise.

## 2014-11-09 NOTE — Telephone Encounter (Signed)
Pt left v/m when xanax last refilled 10/10/14 # 30 with 2 refills given with instructions one daily; pt said in oct 2015 pt was advised could increase to 2 tabs at night if needed. Pt request increased instructions sent to [facility]; see 09/26/14 phone note.

## 2014-11-12 NOTE — Telephone Encounter (Signed)
Ok to refill 60, 3 refills  Sig: 1-2 tabs po qhs prn anxiety or insomnia

## 2014-11-13 MED ORDER — ALPRAZOLAM 0.25 MG PO TABS: ORAL_TABLET | ORAL | Status: DC

## 2014-11-13 NOTE — Telephone Encounter (Signed)
Called to [facility] notified prescription directions have been updated and new Rx called to [facility].

## 2014-11-14 ENCOUNTER — Ambulatory Visit (INDEPENDENT_AMBULATORY_CARE_PROVIDER_SITE_OTHER): Payer: 59 | Admitting: Podiatry

## 2014-11-14 VITALS — BP 119/61 | HR 81 | Resp 16

## 2014-11-14 DIAGNOSIS — M76829 Posterior tibial tendinitis, unspecified leg: Secondary | ICD-10-CM

## 2014-11-14 DIAGNOSIS — M779 Enthesopathy, unspecified: Secondary | ICD-10-CM

## 2014-11-14 DIAGNOSIS — M6789 Other specified disorders of synovium and tendon, multiple sites: Secondary | ICD-10-CM

## 2014-11-14 DIAGNOSIS — S93601D Unspecified sprain of right foot, subsequent encounter: Secondary | ICD-10-CM

## 2014-11-15 NOTE — Patient Instructions (Signed)
Posterior Tibial Tendon Tendinitis with Rehab Tendonitis is a condition that is characterized by inflammation of a tendon or the lining (sheath) that surrounds it. The inflammation is usually caused by damage to the tendon, such as a tendon tear (strain). Sprains are classified into three categories. Grade 1 sprains cause pain, but the tendon is not lengthened. Grade 2 sprains include a lengthened ligament due to the ligament being stretched or partially ruptured. With grade 2 sprains there is still function, although the function may be diminished. Grade 3 sprains are characterized by a complete tear of the tendon or muscle, and function is usually impaired. Posterior tibialis tendonitis is tendonitis of the posterior tibial tendon, which attaches muscles of the lower leg to the foot. The posterior tibial tendon is located in the back of the ankle and helps the body straighten (plantar flex) and rotate inward (medially rotate) the ankle. SYMPTOMS   Pain, tenderness, swelling, warmth, and/or redness over the back of the inner ankle at the posterior tibial tendon or the inner part of the mid-foot.  Pain that worsens with plantar flexion or medial rotation of the ankle.  A crackling sound (crepitation) when the tendon is moved or touched. CAUSES  Posterior tibial tendonitis occurs when damage to the posterior tibial tendon starts an inflammatory response. Common mechanisms of injury include:  Degenerative (occurs with aging) processes that weaken the tendon and make it more susceptible to injury.  Stress placed on the tendon from an increase in the intensity, frequency, or duration of training.  Direct trauma to the ankle.  Returning to activity before a previous ankle injury is allowed to heal. RISK INCREASES WITH:  Activities that involve repetitive and/or stressful plantar flexion (jumping, kicking, or running up/down hills).  Poor strength and flexibility.  Flat feet.  Previous injury  to the foot, ankle, or leg. PREVENTION   Warm up and stretch properly before activity.  Allow for adequate recovery between workouts.  Maintain physical fitness:  Strength, flexibility, and endurance.  Cardiovascular fitness.  Learn and use proper technique. When possible, have a coach correct improper technique.  Complete rehabilitation from a previous foot, ankle, or leg injury.  If you have flat feet, wear arch supports (orthotics). PROGNOSIS  If treated properly, the symptoms of tendonitis usually resolve within 6 weeks. This period may be shorter for injuries caused by direct trauma. RELATED COMPLICATIONS   Prolonged healing time, if improperly treated or reinjured.  Recurrent symptoms that result in a chronic problem.  Partial or complete tendon tear (rupture) requiring surgery. TREATMENT  Treatment initially involves the use of ice and medication to help reduce pain and inflammation. The use of strengthening and stretching exercises may help reduce pain with activity. These exercises may be performed at home or with referral to a therapist. Often times, your caregiver will recommend immobilizing the ankle to allow the tendon to heal. If you have flat feet, you may be advised to wear orthotic arch supports. If symptoms persist for greater than 6 months despite nonsurgical (conservative) treatment, then surgery may be recommended. MEDICATION   If pain medication is necessary, then nonsteroidal anti-inflammatory medications, such as aspirin and ibuprofen, or other minor pain relievers, such as acetaminophen, are often recommended.  Do not take pain medication for 7 days before surgery.  Prescription pain relievers may be given if deemed necessary by your caregiver. Use only as directed and only as much as you need.  Corticosteroid injections may be given by your caregiver. These injections should  be reserved for the most serious cases because they may only be given a certain  number of times. HEAT AND COLD  Cold treatment (icing) relieves pain and reduces inflammation. Cold treatment should be applied for 10 to 15 minutes every 2 to 3 hours for inflammation and pain and immediately after any activity that aggravates your symptoms. Use ice packs or massage the area with a piece of ice (ice massage).  Heat treatment may be used prior to performing the stretching and strengthening activities prescribed by your caregiver, physical therapist, or athletic trainer. Use a heat pack or soak the injury in warm water. SEEK MEDICAL CARE IF:  Treatment seems to offer no benefit, or the condition worsens.  Any medications produce adverse side effects. EXERCISES RANGE OF MOTION (ROM) AND STRETCHING EXERCISES - Posterior Tibial Tendon Tendinitis These exercises may help you when beginning to rehabilitate your injury. Your symptoms may resolve with or without further involvement from your physician, physical therapist or athletic trainer. While completing these exercises, remember:   Restoring tissue flexibility helps normal motion to return to the joints. This allows healthier, less painful movement and activity.  An effective stretch should be held for at least 30 seconds.  A stretch should never be painful. You should only feel a gentle lengthening or release in the stretched tissue. RANGE OF MOTION - Ankle Plantar Flexion   Sit with your right / left leg crossed over your opposite knee.  Use your opposite hand to pull the top of your foot and toes toward you.  You should feel a gentle stretch on the top of your foot/ankle. Hold this position for __________ seconds. Repeat __________ times. Complete this exercise __________ times per day.  RANGE OF MOTION - Ankle Eversion   Sit with your right / left ankle crossed over your opposite knee.  Grip your foot with your opposite hand, placing your thumb on the top of your foot and your fingers across the bottom of your  foot.  Gently push your foot downward with a slight rotation so your littlest toes rise slightly.  You should feel a gentle stretch on the inside of your ankle. Hold the stretch for __________ seconds. Repeat __________ times. Complete this exercise __________ times per day.  RANGE OF MOTION - Ankle Inversion   Sit with your right / left ankle crossed over your opposite knee.  Grip your foot with your opposite hand, placing your thumb on the bottom of your foot and your fingers across the top of your foot.  Gently pull your foot so the smallest toe comes toward you and your thumb pushes the inside of the ball of your foot away from you.  You should feel a gentle stretch on the outside of your ankle. Hold the stretch for __________ seconds. Repeat __________ times. Complete this exercise __________ times per day.  RANGE OF MOTION - Dorsi/Plantar Flexion  While sitting with your right / left knee straight, draw the top of your foot upward by flexing your ankle. Then reverse the motion, pointing your toes downward.  Hold each position for __________ seconds.  After completing your first set of exercises, repeat this exercise with your knee bent. Repeat __________ times. Complete this exercise __________ times per day.  RANGE OF MOTION - Ankle Alphabet  Imagine your right / left big toe is a pen.  Keeping your hip and knee still, write out the entire alphabet with your "pen." Make the letters as large as you can without   increasing any discomfort. Repeat __________ times. Complete this exercise __________ times per day.  STRETCH - Gastrocsoleus   Sit with your right / left leg extended. Holding onto both ends of a belt or towel, loop it around the ball of your foot.  Keeping your right / left ankle and foot relaxed and your knee straight, pull your foot and ankle toward you using the belt/towel.  You should feel a gentle stretch behind your calf or knee. Hold this position for  __________ seconds. Repeat __________ times. Complete this exercise __________ times per day.  STRETCH - Gastroc, Standing   Place hands on wall.  Extend right / left leg, keeping the front knee somewhat bent.  Slightly point your toes inward on your back foot.  Keeping your right / left heel on the floor and your knee straight, shift your weight toward the wall, not allowing your back to arch.  You should feel a gentle stretch in the right / left calf. Hold this position for __________ seconds. Repeat __________ times. Complete this stretch __________ times per day. STRETCH - Soleus, Standing   Place hands on wall.  Extend right / left leg, keeping the other knee somewhat bent.  Slightly point your toes inward on your back foot.  Keep your right / left heel on the floor, bend your back knee, and slightly shift your weight over the back leg so that you feel a gentle stretch deep in your back calf.  Hold this position for __________ seconds. Repeat __________ times. Complete this stretch __________ times per day. STRENGTHENING EXERCISES - Posterior Tibial Tendon Tendinitis These exercises may help you when beginning to rehabilitate your injury. They may resolve your symptoms with or without further involvement from your physician, physical therapist, or athletic trainer. While completing these exercises, remember:   Muscles can gain both the endurance and the strength needed for everyday activities through controlled exercises.  Complete these exercises as instructed by your physician, physical therapist, or athletic trainer. Progress the resistance and repetitions only as guided. STRENGTH - Dorsiflexors  Secure a rubber exercise band/tubing to a fixed object (i.e., table, pole) and loop the other end around your right / left foot.  Sit on the floor facing the fixed object. The band/tubing should be slightly tense when your foot is relaxed.  Slowly draw your foot back toward you  using your ankle and toes.  Hold this position for __________ seconds. Slowly release the tension in the band and return your foot to the starting position. Repeat __________ times. Complete this exercise __________ times per day.  STRENGTH - Towel Curls  Sit in a chair positioned on a non-carpeted surface.  Place your foot on a towel, keeping your heel on the floor.  Pull the towel toward your heel by only curling your toes. Keep your heel on the floor.  If instructed by your physician, physical therapist, or athletic trainer, add ____________________ at the end of the towel. Repeat __________ times. Complete this exercise __________ times per day. STRENGTH - Ankle Eversion   Secure one end of a rubber exercise band/tubing to a fixed object (table, pole). Loop the other end around your foot just before your toes.  Place your fists between your knees. This will focus your strengthening at your ankle.  Drawing the band/tubing across your opposite foot, slowly pull your little toe out and up. Make sure the band/tubing is positioned to resist the entire motion.  Hold this position for __________ seconds.  Have   your muscles resist the band/tubing as it slowly pulls your foot back to the starting position. Repeat __________ times. Complete this exercise __________ times per day.  STRENGTH - Ankle Inversion   Secure one end of a rubber exercise band/tubing to a fixed object (table, pole). Loop the other end around your foot just before your toes.  Place your fists between your knees. This will focus your strengthening at your ankle.  Slowly, pull your big toe up and in, making sure the band/tubing is positioned to resist the entire motion.  Hold this position for __________ seconds.  Have your muscles resist the band/tubing as it slowly pulls your foot back to the starting position. Repeat __________ times. Complete this exercises __________ times per day.  Document Released:  11/24/2005 Document Revised: 04/10/2014 Document Reviewed: 03/08/2009 [employer] Patient Information 2015 [employer]. This information is not intended to replace advice given to you by your health care provider. Make sure you discuss any questions you have with your health care provider.  

## 2014-11-15 NOTE — Progress Notes (Signed)
Patient ID: Nichole Hogan, female   DOB: [birth year], 53 y.o.   MRN: [id number]  Subjective: 53 year old female returns the office they for follow-up evaluation of posterior tibial tendinitis and likely posterior tibial tendon dysfunction, spring ligament sprain. She states that she has been doing well wearing the ankle brace and she's had thickened decrease in symptoms. However last Monday at work she did trip and she's had slight recurrence of symptoms and the same area. She denies any swelling, erythema. No other complaints at this time and no acute changes since last appointment.  Objective: AAO x3, NAD DP/PT pulses palpable bilaterally, CRT less than 3 seconds Protective sensation intact with Simms Weinstein monofilament, vibratory sensation intact, Achilles tendon reflex intact There is mild to palpation overlying the posterior aspect of the medial malleolus on the course of the posterior tibial tendon. There is no pain along the insertion into the navicular. There is no overlying edema, erythema, increase in warmth. There is decreased range of motion of the first MTPJ. There is no specific areas of pinpoint bony tenderness or pain with vibratory sensation. MMT 5/5, ROM WNL except for the 1st MTPJ.  No open lesions or pre-ulcerative lesions. No pain with calf compression, swelling, warmth, erythema.  Assessment: 53 year old female with likely posterior tibial tendon dysfunction, spring ligament sprain.  Plan: -Treatment options were discussed including alternatives, risks, complications. -At this time discussed the patient that as she has had resolution of symptoms before a reinjury we'll hold off on physical therapy at this time. Continue to wear the brace until symptoms resolve. Discussed that she can start stretching/strengthening exercises for which I discussed with her. Physical therapy slowly start his exercises and to wean off wearing the brace. Continue to ice the area. Discussed the  importance of supportive shoe gear and inserts to help support her foot type. -Discussed possible steroid injection at this appointment however we'll hold off at this time. -Follow-up as needed. Discussed with patient symptoms do not resolve within the next 3-4 weeks to call the office and make an appointment. In the meantime, call the office with any questions, concerns, change in symptoms.

## 2015-01-07 ENCOUNTER — Other Ambulatory Visit: Payer: Self-pay | Admitting: Family Medicine

## 2015-01-07 NOTE — Telephone Encounter (Signed)
Last office visit 09/25/2014 with Dr. Damita Dunnings.  Last refilled

## 2015-01-08 ENCOUNTER — Telehealth: Payer: Self-pay | Admitting: *Deleted

## 2015-01-08 MED ORDER — ALPRAZOLAM 0.5 MG PO TABS
0.5000 mg | ORAL_TABLET | Freq: Three times a day (TID) | ORAL | Status: DC | PRN
Start: 2015-01-08 — End: 2015-05-23

## 2015-01-08 NOTE — Telephone Encounter (Signed)
Patient left a voicemail stating that she had requested a refill on her Alprazolam and requested that it be increased. Last office visit 09/25/14/acute.

## 2015-01-08 NOTE — Telephone Encounter (Signed)
She is correct. I thought that we had increased the dose to 0.5 mg. We talked about it last year.  Change script to Xanax 0.5 mg, 1 po TID prn anxiety, #60, 2 refills

## 2015-01-08 NOTE — Telephone Encounter (Signed)
[facility] to [facility] notified prescription has been called to her pharmacy.

## 2015-02-26 ENCOUNTER — Ambulatory Visit (INDEPENDENT_AMBULATORY_CARE_PROVIDER_SITE_OTHER): Payer: 59 | Admitting: Family Medicine

## 2015-02-26 ENCOUNTER — Telehealth: Payer: Self-pay | Admitting: *Deleted

## 2015-02-26 ENCOUNTER — Encounter: Payer: Self-pay | Admitting: Family Medicine

## 2015-02-26 VITALS — BP 108/58 | HR 70 | Temp 98.6°F | Ht 64.0 in | Wt 124.8 lb

## 2015-02-26 DIAGNOSIS — F331 Major depressive disorder, recurrent, moderate: Secondary | ICD-10-CM

## 2015-02-26 DIAGNOSIS — J449 Chronic obstructive pulmonary disease, unspecified: Secondary | ICD-10-CM

## 2015-02-26 DIAGNOSIS — R42 Dizziness and giddiness: Secondary | ICD-10-CM

## 2015-02-26 DIAGNOSIS — R5383 Other fatigue: Secondary | ICD-10-CM

## 2015-02-26 LAB — CBC WITH DIFFERENTIAL/PLATELET
Basophils Absolute: 0 10*3/uL (ref 0.0–0.1)
Basophils Relative: 0.3 % (ref 0.0–3.0)
EOS PCT: 1.6 % (ref 0.0–5.0)
Eosinophils Absolute: 0.2 10*3/uL (ref 0.0–0.7)
HCT: 43.7 % (ref 36.0–46.0)
HEMOGLOBIN: 14.9 g/dL (ref 12.0–15.0)
LYMPHS PCT: 16.9 % (ref 12.0–46.0)
Lymphs Abs: 2 10*3/uL (ref 0.7–4.0)
MCHC: 34.2 g/dL (ref 30.0–36.0)
MCV: 88.6 fl (ref 78.0–100.0)
Monocytes Absolute: 0.5 10*3/uL (ref 0.1–1.0)
Monocytes Relative: 4.3 % (ref 3.0–12.0)
NEUTROS PCT: 76.9 % (ref 43.0–77.0)
Neutro Abs: 9.2 10*3/uL — ABNORMAL HIGH (ref 1.4–7.7)
PLATELETS: 198 10*3/uL (ref 150.0–400.0)
RBC: 4.94 Mil/uL (ref 3.87–5.11)
RDW: 13.7 % (ref 11.5–15.5)
WBC: 11.9 10*3/uL — ABNORMAL HIGH (ref 4.0–10.5)

## 2015-02-26 LAB — BASIC METABOLIC PANEL
BUN: 13 mg/dL (ref 6–23)
CO2: 30 mEq/L (ref 19–32)
CREATININE: 0.67 mg/dL (ref 0.40–1.20)
Calcium: 9.9 mg/dL (ref 8.4–10.5)
Chloride: 105 mEq/L (ref 96–112)
GFR: 97.65 mL/min (ref >60.00)
Glucose, Bld: 127 mg/dL — ABNORMAL HIGH (ref 70–99)
POTASSIUM: 4 meq/L (ref 3.5–5.1)
Sodium: 140 mEq/L (ref 135–145)

## 2015-02-26 LAB — TSH: TSH: 1.65 u[IU]/mL (ref 0.35–4.50)

## 2015-02-26 MED ORDER — FORMOTEROL FUMARATE 12 MCG IN CAPS: 12.0000 ug | ORAL_CAPSULE | Freq: Two times a day (BID) | RESPIRATORY_TRACT | Status: DC

## 2015-02-26 NOTE — Progress Notes (Signed)
Pre visit review using our clinic review tool, if applicable. No additional management support is needed unless otherwise documented below in the visit note. 

## 2015-02-26 NOTE — Telephone Encounter (Signed)
Left voicemail for patient to call back. To check if patient have received the flu vaccine.  

## 2015-02-26 NOTE — Progress Notes (Signed)
Dr. Frederico Hamman T. Modene Andy, MD, [facility] [place], [address] Phone: [phone] Fax: [phone]  02/26/2015  Patient: Nichole Hogan, MRN: [id number], DOB: 1961-05-11, 54 y.o.  Primary Physician:  Owens Loffler, MD  Chief Complaint: Dizziness; Chest Pain; and Stress  Subjective:   Nichole Hogan is a 54 y.o. very pleasant female patient who presents with the following:  Getting some dizzy spells.  Having a lot of stress between work.  Father - married to a wife who is a drug addict.  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  Great deal of stress with this.   Feeling like she is shaking all over. Head will spin like vertigo. Stays tired.  Lightheaded - like going to pass out.   Chest pain with upset - not with exercise.  Works and walks without problems  1 1/2 tabs of celexa.   Past Medical History, Surgical History, Social History, Family History, Problem List, Medications, and Allergies have been reviewed and updated if relevant.   GEN: No acute illnesses, no fevers, chills. GI: No n/v/d, eating normally Pulm: No SOB Interactive and getting along well at home.  Otherwise, ROS is as per the HPI.  Objective:   BP 108/58 mmHg  Pulse 70  Temp(Src) 98.6 F (37 C) (Oral)  Ht 5\' 4"  (1.626 m)  Wt 124 lb 12 oz (56.586 kg)  BMI 21.40 kg/m2  GEN: WDWN, NAD, Non-toxic, A & O x 3 HEENT: Atraumatic, Normocephalic. Neck supple. No masses, No LAD. Ears and Nose: No external deformity. CV: RRR, No M/G/R. No JVD. No thrill. No extra heart sounds. PULM: CTA B, no wheezes, crackles, rhonchi. No retractions. No resp. distress. No accessory muscle use. EXTR: No c/c/e NEURO Normal gait.  PSYCH: Normally interactive. Conversant. Not depressed or anxious appearing.  Calm demeanor.   Laboratory and Imaging Data: Results for orders placed or performed in visit on 02/26/15  CBC with Differential/Platelet  Result Value Ref  Range   WBC 11.9 (H) 4.0 - 10.5 K/uL   RBC 4.94 3.87 - 5.11 Mil/uL   Hemoglobin 14.9 12.0 - 15.0 g/dL   HCT 43.7 36.0 - 46.0 %   MCV 88.6 78.0 - 100.0 fl   MCHC 34.2 30.0 - 36.0 g/dL   RDW 13.7 11.5 - 15.5 %   Platelets 198.0 150.0 - 400.0 K/uL   Neutrophils Relative % 76.9 43.0 - 77.0 %   Lymphocytes Relative 16.9 12.0 - 46.0 %   Monocytes Relative 4.3 3.0 - 12.0 %   Eosinophils Relative 1.6 0.0 - 5.0 %   Basophils Relative 0.3 0.0 - 3.0 %   Neutro Abs 9.2 (H) 1.4 - 7.7 K/uL   Lymphs Abs 2.0 0.7 - 4.0 K/uL   Monocytes Absolute 0.5 0.1 - 1.0 K/uL   Eosinophils Absolute 0.2 0.0 - 0.7 K/uL   Basophils Absolute 0.0 0.0 - 0.1 K/uL  Basic metabolic panel  Result Value Ref Range   Sodium 140 135 - 145 mEq/L   Potassium 4.0 3.5 - 5.1 mEq/L   Chloride 105 96 - 112 mEq/L   CO2 30 19 - 32 mEq/L   Glucose, Bld 127 (H) 70 - 99 mg/dL   BUN 13 6 - 23 mg/dL   Creatinine, Ser 0.67 0.40 - 1.20 mg/dL   Calcium 9.9 8.4 - 10.5 mg/dL   GFR 97.65 >60.00 mL/min  TSH  Result Value Ref Range   TSH 1.65 0.35 - 4.50 uIU/mL  Assessment and Plan:   Lightheaded - Plan: CBC with Differential/Platelet, Basic metabolic panel, TSH  Other fatigue - Plan: CBC with Differential/Platelet, Basic metabolic panel, TSH  Chronic obstructive pulmonary disease, unspecified COPD, unspecified chronic bronchitis type  Major depressive disorder, recurrent episode, moderate  This is very atypical chest pain and not exertional at all.  It worsens with stress, and she is under a great deal of psychological stress right now.  Increase her Celexa dosing.  She Arty has Xanax that she takes rarely, and she can take this slightly more frequently if needed.  Trial of Foradil for her COPD.  She has discontinued all other inhalers.  She continues to smoke.  Follow-up: No Follow-up on file.  New Prescriptions   FORMOTEROL (FORADIL) 12 MCG CAPSULE FOR INHALER    Place 1 capsule (12 mcg total) into inhaler and inhale 2  (two) times daily.   Orders Placed This Encounter  Procedures  . CBC with Differential/Platelet  . Basic metabolic panel  . TSH    Signed,  Frederico Hamman T. Jerrianne Hartin, MD   Patient's Medications  New Prescriptions   FORMOTEROL (FORADIL) 12 MCG CAPSULE FOR INHALER    Place 1 capsule (12 mcg total) into inhaler and inhale 2 (two) times daily.  Previous Medications   ALPRAZOLAM (XANAX) 0.5 MG TABLET    Take 1 tablet (0.5 mg total) by mouth 3 (three) times daily as needed for anxiety.  Modified Medications   Modified Medication Previous Medication   CITALOPRAM (CELEXA) 20 MG TABLET citalopram (CELEXA) 20 MG tablet      Take 1.5 tablets (30 mg total) by mouth daily.    TAKE 1 TABLEY BY MOUTH DAILY  Discontinued Medications   ALBUTEROL (PROVENTIL HFA;VENTOLIN HFA) 108 (90 BASE) MCG/ACT INHALER    Inhale 1-2 puffs into the lungs every 6 (six) hours as needed for wheezing or shortness of breath.   CITALOPRAM (CELEXA) 20 MG TABLET    TAKE 1 & 1/2 TABLETS BY MOUTH EVERY DAY   DOXYCYCLINE (VIBRA-TABS) 100 MG TABLET    Take 1 tablet (100 mg total) by mouth 2 (two) times daily.   PANTOPRAZOLE (PROTONIX) 40 MG TABLET    Take 40 mg by mouth daily as needed.   PREDNISONE (DELTASONE) 20 MG TABLET    Take 1 tablet (20 mg total) by mouth daily with breakfast.

## 2015-02-27 MED ORDER — CITALOPRAM HYDROBROMIDE 20 MG PO TABS: 30.0000 mg | ORAL_TABLET | Freq: Every day | ORAL | Status: DC

## 2015-05-23 ENCOUNTER — Other Ambulatory Visit: Payer: Self-pay | Admitting: Family Medicine

## 2015-05-24 NOTE — Telephone Encounter (Signed)
rx called into pharmacy

## 2015-05-24 NOTE — Telephone Encounter (Signed)
Ok to refill 60, 2 ref 

## 2015-05-24 NOTE — Telephone Encounter (Signed)
Last filled 01/08/2015

## 2015-05-28 ENCOUNTER — Other Ambulatory Visit: Payer: Self-pay | Admitting: Family Medicine

## 2015-06-28 ENCOUNTER — Telehealth: Payer: Self-pay

## 2015-06-28 NOTE — Telephone Encounter (Signed)
Left a message for patient to return my call about having a Mammogram set up. Will await call back.  

## 2015-07-11 ENCOUNTER — Telehealth: Payer: Self-pay
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
## 2015-07-11 NOTE — Telephone Encounter (Signed)
Verlin notified as instructed by telephone.
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
## 2015-09-03 ENCOUNTER — Other Ambulatory Visit: Payer: Self-pay | Admitting: Family Medicine

## 2015-11-05 ENCOUNTER — Telehealth: Payer: Self-pay | Admitting: Family Medicine

## 2015-11-05 NOTE — Telephone Encounter (Signed)
[facility]  Patient Name: Nichole Hogan  DOB: 24-Feb-1961    Initial Comment Caller states does not know what she can take with the other medication she is already on; she is having sinus stuff; sore throat, sneezing    Nurse Assessment      Guidelines    Guideline Title Affirmed Question Affirmed Notes       Final Disposition User   FINAL ATTEMPT MADE - no message left Harlow Mares, Therapist, sports, Suanne Marker

## 2015-11-22 ENCOUNTER — Other Ambulatory Visit: Payer: Self-pay | Admitting: Family Medicine

## 2015-11-22 NOTE — Telephone Encounter (Signed)
Ok to refill  60, 2 refills 

## 2015-11-22 NOTE — Telephone Encounter (Signed)
Last office visit 02/26/2015.  Last refilled 05/24/2015 for #60 with 2 refills. Ok to refill?

## 2015-11-22 NOTE — Telephone Encounter (Signed)
[facility]am [facility] [place].

## 2015-12-13 ENCOUNTER — Telehealth: Payer: Self-pay

## 2015-12-13 MED ORDER — SALMETEROL XINAFOATE 50 MCG/DOSE IN AEPB: 1.0000 | INHALATION_SPRAY | Freq: Two times a day (BID) | RESPIRATORY_TRACT | Status: DC

## 2015-12-13 NOTE — Telephone Encounter (Signed)
[facility]; Foradil cap for inhaler is on back order and unable to get [facility] and request substitute med sent to [facility].

## 2015-12-13 NOTE — Telephone Encounter (Signed)
Serevent Diskus DPI 1 puff q 12 hours Disp 1 diskus (3 month supply is also OK, her preference.)  1 year

## 2015-12-24 IMAGING — CR DG ANKLE COMPLETE 3+V*R*
2 series · 2 of 2 positions shown · non-contrast
Comparison: None.

CLINICAL DATA: Ankle pain

EXAM:
RIGHT ANKLE - COMPLETE 3+ VIEW

[view not recorded (1 of 2)]
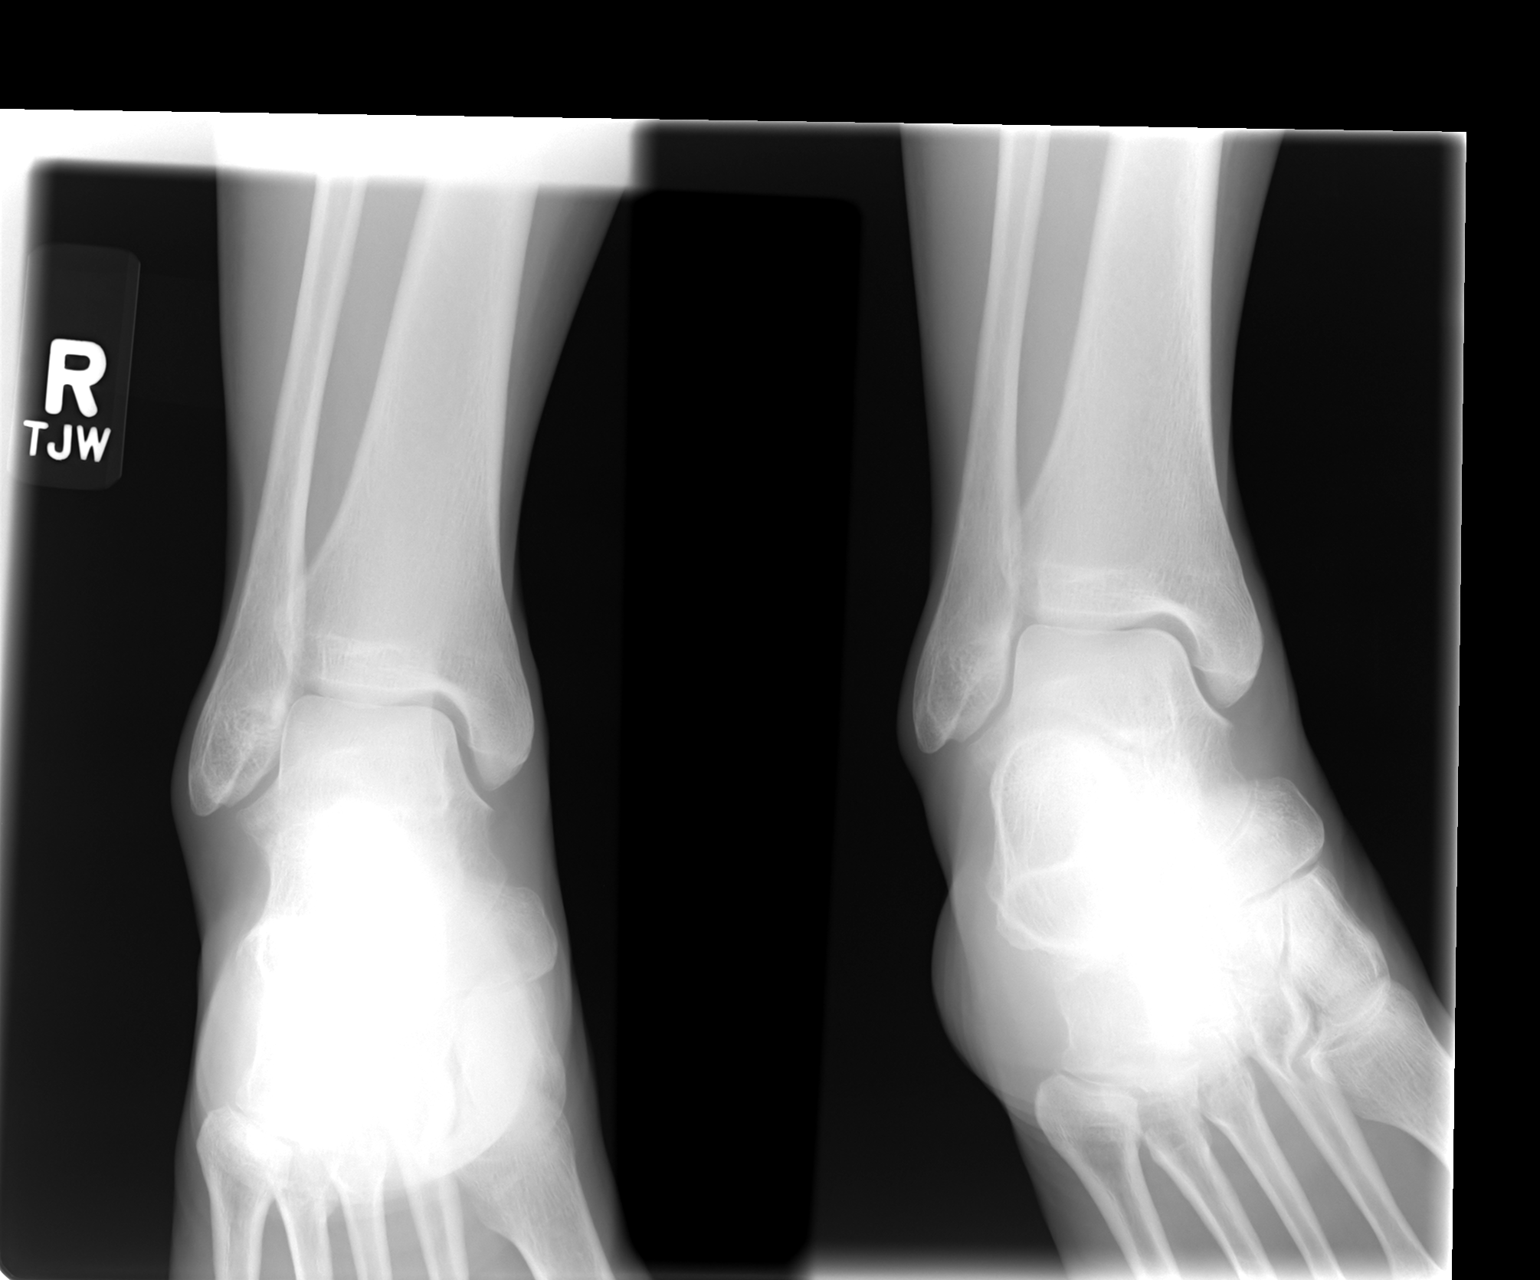

[view not recorded (2 of 2)]
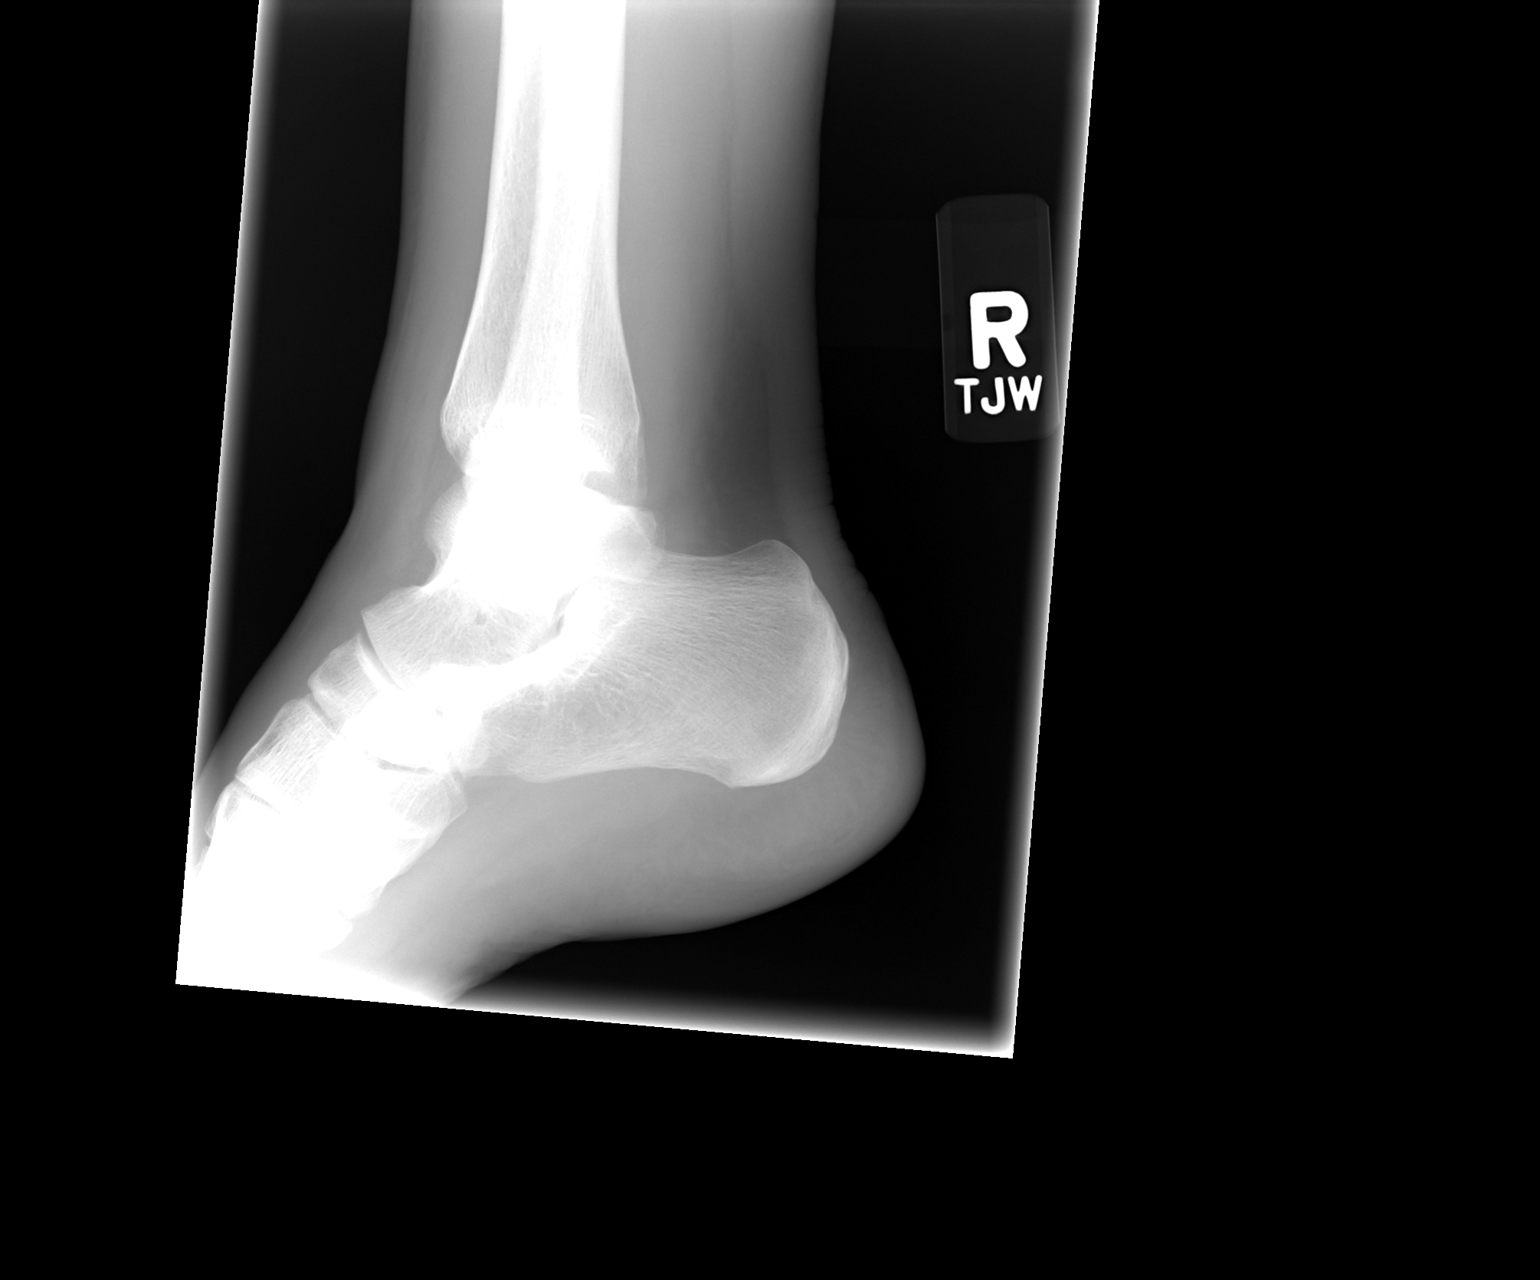

[2 of 2 positions shown; findings below may reference images not displayed]

FINDINGS: No acute fracture.  No dislocation.
IMPRESSION: No acute bony pathology.

## 2016-03-03 ENCOUNTER — Telehealth: Payer: Self-pay | Admitting: Family Medicine

## 2016-03-03 NOTE — Telephone Encounter (Signed)
Please call and schedule CPE with fasting labs prior for Dr. Copland.  

## 2016-03-03 NOTE — Telephone Encounter (Signed)
Last office visit 02/26/2015.  No future appointments scheduled.  Refill?

## 2016-03-03 NOTE — Telephone Encounter (Signed)
Ok to ref 48, 3 ref  F/u cpx

## 2016-03-17 ENCOUNTER — Ambulatory Visit (INDEPENDENT_AMBULATORY_CARE_PROVIDER_SITE_OTHER): Payer: 59 | Admitting: Family Medicine

## 2016-03-17 ENCOUNTER — Encounter: Payer: Self-pay | Admitting: Family Medicine

## 2016-03-17 VITALS — BP 132/84 | HR 76 | Temp 98.4°F | Ht 63.5 in | Wt 119.8 lb

## 2016-03-17 DIAGNOSIS — K146 Glossodynia: Secondary | ICD-10-CM
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
## 2016-03-17 NOTE — Progress Notes (Signed)
Pre visit review using our clinic review tool, if applicable. No additional management support is needed unless otherwise documented below in the visit note. 

## 2016-03-18 NOTE — Telephone Encounter (Signed)
Left message asking pt to call office  °

## 2016-03-18 NOTE — Telephone Encounter (Signed)
cpx 7/5 pt wanted labs same day Pt aware Please close

## 2016-05-31 ENCOUNTER — Other Ambulatory Visit: Payer: Self-pay | Admitting: Family Medicine

## 2016-05-31 NOTE — Telephone Encounter (Signed)
Last office visit 03/17/2016.  Last refilled 11/22/2015 for #60 with 2 refills. Ok to refill?

## 2016-06-01 NOTE — Telephone Encounter (Signed)
Ok to refill  60, 2 refills 

## 2016-06-02 NOTE — Telephone Encounter (Signed)
[facility] [place]

## 2016-06-11 ENCOUNTER — Encounter: Payer: 59 | Admitting: Family Medicine

## 2016-06-28 ENCOUNTER — Other Ambulatory Visit: Payer: Self-pay | Admitting: Family Medicine

## 2016-06-28 NOTE — Telephone Encounter (Signed)
Last office visit 03/17/2016 for spots on tongue. Last CPE 03/29/2012. No future appointments.  Refill?

## 2016-10-27 ENCOUNTER — Other Ambulatory Visit: Payer: Self-pay | Admitting: Family Medicine

## 2016-11-19 ENCOUNTER — Encounter: Payer: Self-pay | Admitting: Family Medicine

## 2016-11-19 ENCOUNTER — Ambulatory Visit (INDEPENDENT_AMBULATORY_CARE_PROVIDER_SITE_OTHER): Payer: Managed Care, Other (non HMO) | Admitting: Family Medicine

## 2016-11-19 VITALS — BP 120/72 | HR 76 | Temp 97.6°F | Ht 63.5 in | Wt 119.8 lb

## 2016-11-19 DIAGNOSIS — Z0000 Encounter for general adult medical examination without abnormal findings: Secondary | ICD-10-CM | POA: Diagnosis not present

## 2016-11-19 DIAGNOSIS — Z114 Encounter for screening for human immunodeficiency virus [HIV]: Secondary | ICD-10-CM

## 2016-11-19 DIAGNOSIS — Z1159 Encounter for screening for other viral diseases: Secondary | ICD-10-CM

## 2016-11-19 DIAGNOSIS — Z131 Encounter for screening for diabetes mellitus: Secondary | ICD-10-CM

## 2016-11-19 DIAGNOSIS — R5383 Other fatigue: Secondary | ICD-10-CM | POA: Diagnosis not present

## 2016-11-19 DIAGNOSIS — Z13220 Encounter for screening for lipoid disorders: Secondary | ICD-10-CM

## 2016-11-19 LAB — LIPID PANEL
CHOL/HDL RATIO: 2
Cholesterol: 173 mg/dL (ref 0–200)
HDL: 86.3 mg/dL (ref >39.00)
LDL Cholesterol: 75 mg/dL (ref 0–99)
NONHDL: 87.05
TRIGLYCERIDES: 62 mg/dL (ref 0.0–149.0)
VLDL: 12.4 mg/dL (ref 0.0–40.0)

## 2016-11-19 LAB — TSH: TSH: 1.43 u[IU]/mL (ref 0.35–4.50)

## 2016-11-19 LAB — CBC WITH DIFFERENTIAL/PLATELET
BASOS PCT: 1 % (ref 0.0–3.0)
Basophils Absolute: 0.1 10*3/uL (ref 0.0–0.1)
EOS ABS: 0.3 10*3/uL (ref 0.0–0.7)
Eosinophils Relative: 3.8 % (ref 0.0–5.0)
HEMATOCRIT: 45.7 % (ref 36.0–46.0)
Hemoglobin: 15.8 g/dL — ABNORMAL HIGH (ref 12.0–15.0)
LYMPHS PCT: 23.3 % (ref 12.0–46.0)
Lymphs Abs: 1.8 10*3/uL (ref 0.7–4.0)
MCHC: 34.6 g/dL (ref 30.0–36.0)
MCV: 90.6 fl (ref 78.0–100.0)
MONOS PCT: 5.4 % (ref 3.0–12.0)
Monocytes Absolute: 0.4 10*3/uL (ref 0.1–1.0)
NEUTROS ABS: 5.2 10*3/uL (ref 1.4–7.7)
Neutrophils Relative %: 66.5 % (ref 43.0–77.0)
Platelets: 195 10*3/uL (ref 150.0–400.0)
RBC: 5.04 Mil/uL (ref 3.87–5.11)
RDW: 13.6 % (ref 11.5–15.5)
WBC: 7.8 10*3/uL (ref 4.0–10.5)

## 2016-11-19 LAB — BASIC METABOLIC PANEL
BUN: 21 mg/dL (ref 6–23)
CHLORIDE: 105 meq/L (ref 96–112)
CO2: 30 meq/L (ref 19–32)
CREATININE: 0.64 mg/dL (ref 0.40–1.20)
Calcium: 9.9 mg/dL (ref 8.4–10.5)
GFR: 102.29 mL/min (ref >60.00)
Glucose, Bld: 100 mg/dL — ABNORMAL HIGH (ref 70–99)
POTASSIUM: 4.8 meq/L (ref 3.5–5.1)
SODIUM: 142 meq/L (ref 135–145)

## 2016-11-19 LAB — HEPATIC FUNCTION PANEL
ALBUMIN: 4.9 g/dL (ref 3.5–5.2)
ALK PHOS: 67 U/L (ref 39–117)
ALT: 16 U/L (ref 0–35)
AST: 17 U/L (ref 0–37)
BILIRUBIN DIRECT: 0.2 mg/dL (ref 0.0–0.3)
TOTAL PROTEIN: 7 g/dL (ref 6.0–8.3)
Total Bilirubin: 1.2 mg/dL (ref 0.2–1.2)

## 2016-11-19 MED ORDER — CITALOPRAM HYDROBROMIDE 40 MG PO TABS: 40.0000 mg | ORAL_TABLET | Freq: Every day | ORAL | 3 refills | Status: DC

## 2016-11-19 MED ORDER — SALMETEROL XINAFOATE 50 MCG/DOSE IN AEPB: 1.0000 | INHALATION_SPRAY | Freq: Two times a day (BID) | RESPIRATORY_TRACT | 12 refills | Status: DC

## 2016-11-19 NOTE — Progress Notes (Signed)
Pre visit review using our clinic review tool, if applicable. No additional management support is needed unless otherwise documented below in the visit note. 
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
## 2016-11-20 ENCOUNTER — Encounter: Payer: Self-pay | Admitting: *Deleted

## 2016-11-20 LAB — HIV ANTIBODY (ROUTINE TESTING W REFLEX): HIV 1&2 Ab, 4th Generation: NONREACTIVE

## 2016-11-20 LAB — HEPATITIS C ANTIBODY: HCV AB: NEGATIVE

## 2016-12-08 ENCOUNTER — Other Ambulatory Visit: Payer: Self-pay | Admitting: Family Medicine

## 2016-12-08 NOTE — Telephone Encounter (Signed)
Last office visit 11/19/2016.  Last refilled 06/01/16 for #60 with 2 refills.  Ok to refill?

## 2016-12-09 NOTE — Telephone Encounter (Signed)
[id number] [place] [place] - [address] Phone: [phone] 

## 2016-12-09 NOTE — Telephone Encounter (Signed)
Ok to refill  60, 2 refills 

## 2017-02-13 ENCOUNTER — Other Ambulatory Visit: Payer: Self-pay | Admitting: Family Medicine

## 2017-02-13 DIAGNOSIS — Z1231 Encounter for screening mammogram for malignant neoplasm of breast: Secondary | ICD-10-CM

## 2017-03-16 ENCOUNTER — Ambulatory Visit
Admission: RE | Admit: 2017-03-16 | Discharge: 2017-03-16 | Disposition: A | Discharge status: Home / Self Care | Payer: Managed Care, Other (non HMO) | Source: Ambulatory Visit | Attending: Family Medicine | Admitting: Family Medicine

## 2017-03-16 DIAGNOSIS — Z1231 Encounter for screening mammogram for malignant neoplasm of breast: Secondary | ICD-10-CM

## 2017-06-24 ENCOUNTER — Encounter: Payer: Self-pay | Admitting: Family Medicine

## 2017-06-24 ENCOUNTER — Ambulatory Visit (INDEPENDENT_AMBULATORY_CARE_PROVIDER_SITE_OTHER): Payer: Managed Care, Other (non HMO) | Admitting: Family Medicine

## 2017-06-24 VITALS — BP 112/72 | HR 71 | Temp 98.7°F | Ht 63.5 in | Wt 122.5 lb

## 2017-06-24 DIAGNOSIS — L03011 Cellulitis of right finger: Secondary | ICD-10-CM | POA: Diagnosis not present

## 2017-06-24 DIAGNOSIS — Z23 Encounter for immunization: Secondary | ICD-10-CM

## 2017-06-24 MED ORDER — SULFAMETHOXAZOLE-TRIMETHOPRIM 800-160 MG PO TABS: 2.0000 | ORAL_TABLET | Freq: Two times a day (BID) | ORAL | 0 refills | Status: DC
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
## 2017-06-24 NOTE — Addendum Note (Signed)
Addended by: Carter Kitten on: 06/24/2017 10:51 AM   Modules accepted: Orders

## 2017-06-25 ENCOUNTER — Other Ambulatory Visit: Payer: Self-pay | Admitting: Family Medicine

## 2017-06-26 NOTE — Telephone Encounter (Signed)
Last Rx 12/2016 #60 2R. Last OV 06/2017-acute. pls advise

## 2017-06-26 NOTE — Telephone Encounter (Signed)
Ok to refill 60, 2 ref

## 2017-06-27 LAB — WOUND CULTURE
GRAM STAIN: NONE SEEN
GRAM STAIN: NONE SEEN
Gram Stain: NONE SEEN
ORGANISM ID, BACTERIA: NO GROWTH

## 2017-06-29 NOTE — Telephone Encounter (Signed)
Alprazolam called into [id number] - [place], [place] - [address] Phone: [phone]

## 2017-11-18 ENCOUNTER — Other Ambulatory Visit: Payer: Self-pay | Admitting: Family Medicine

## 2018-01-02 ENCOUNTER — Other Ambulatory Visit: Payer: Self-pay | Admitting: Family Medicine

## 2018-01-03 NOTE — Telephone Encounter (Signed)
Last office visit 06/24/17.  Last refilled 7/23/118 for #60 with 2 refills.  Ok to refill?

## 2018-01-15 ENCOUNTER — Other Ambulatory Visit: Payer: Self-pay | Admitting: *Deleted

## 2018-01-15 NOTE — Telephone Encounter (Signed)
Last office visit 06/24/2017.  Last CPE 11/19/2016.  No future appointments.  Pharmacy is requesting 90 day supply.  Ok to refill?

## 2018-01-17 MED ORDER — CITALOPRAM HYDROBROMIDE 40 MG PO TABS: 40.0000 mg | ORAL_TABLET | Freq: Every day | ORAL | 1 refills | Status: DC

## 2018-02-22 ENCOUNTER — Telehealth: Payer: Self-pay | Admitting: *Deleted

## 2018-02-22 NOTE — Telephone Encounter (Signed)
Copied from [place] [id number]. Topic: General - Other >> Feb 22, 2018  8:10 AM Yvette Rack wrote: Reason for CRM: patient calling stating that her citalopram (CELEXA) 40 MG tablet couldn't be submitted pt has one pill left she would like a call back on her cell when this has been handled

## 2018-02-22 NOTE — Telephone Encounter (Signed)
Called and spoke to patient and was advised that she called in a refill on the bottle of medication that she had and the system told her that she needed the doctor to authorize the refill.  Advised patient that a refill was sent in on 01/17/18 with #90 and 1 refill. Advised patient that she needs to schedule a physical for further refills. Advised patient that there should be a refill on file for her and she should call and talk with the pharmacy about this. Patient verbalized understanding and stated that she will contact the pharmacy.

## 2018-04-26 ENCOUNTER — Ambulatory Visit (INDEPENDENT_AMBULATORY_CARE_PROVIDER_SITE_OTHER): Payer: Managed Care, Other (non HMO) | Admitting: Family Medicine

## 2018-04-26 ENCOUNTER — Other Ambulatory Visit: Payer: Self-pay

## 2018-04-26 ENCOUNTER — Encounter: Payer: Managed Care, Other (non HMO) | Admitting: Family Medicine

## 2018-04-26 ENCOUNTER — Encounter: Payer: Self-pay | Admitting: Family Medicine

## 2018-04-26 VITALS — BP 130/70 | HR 56 | Temp 98.5°F | Ht 63.5 in | Wt 121.8 lb

## 2018-04-26 DIAGNOSIS — Z0000 Encounter for general adult medical examination without abnormal findings: Secondary | ICD-10-CM

## 2018-04-26 LAB — HEPATIC FUNCTION PANEL
ALBUMIN: 4.8 g/dL (ref 3.5–5.2)
ALT: 16 U/L (ref 0–35)
AST: 19 U/L (ref 0–37)
Alkaline Phosphatase: 61 U/L (ref 39–117)
Bilirubin, Direct: 0.3 mg/dL (ref 0.0–0.3)
Total Bilirubin: 1.3 mg/dL — ABNORMAL HIGH (ref 0.2–1.2)
Total Protein: 7.5 g/dL (ref 6.0–8.3)

## 2018-04-26 LAB — LIPID PANEL
CHOL/HDL RATIO: 2
Cholesterol: 176 mg/dL (ref 0–200)
HDL: 89.4 mg/dL (ref >39.00)
LDL Cholesterol: 74 mg/dL (ref 0–99)
NONHDL: 86.53
Triglycerides: 62 mg/dL (ref 0.0–149.0)
VLDL: 12.4 mg/dL (ref 0.0–40.0)

## 2018-04-26 LAB — CBC WITH DIFFERENTIAL/PLATELET
BASOS PCT: 0.5 % (ref 0.0–3.0)
Basophils Absolute: 0.1 10*3/uL (ref 0.0–0.1)
EOS PCT: 0.8 % (ref 0.0–5.0)
Eosinophils Absolute: 0.1 10*3/uL (ref 0.0–0.7)
HEMATOCRIT: 44.4 % (ref 36.0–46.0)
Hemoglobin: 15.8 g/dL — ABNORMAL HIGH (ref 12.0–15.0)
Lymphocytes Relative: 12.5 % (ref 12.0–46.0)
Lymphs Abs: 1.3 10*3/uL (ref 0.7–4.0)
MCHC: 35.6 g/dL (ref 30.0–36.0)
MCV: 90.8 fl (ref 78.0–100.0)
MONOS PCT: 4.7 % (ref 3.0–12.0)
Monocytes Absolute: 0.5 10*3/uL (ref 0.1–1.0)
Neutro Abs: 8.6 10*3/uL — ABNORMAL HIGH (ref 1.4–7.7)
Neutrophils Relative %: 81.5 % — ABNORMAL HIGH (ref 43.0–77.0)
Platelets: 197 10*3/uL (ref 150.0–400.0)
RBC: 4.89 Mil/uL (ref 3.87–5.11)
RDW: 13.6 % (ref 11.5–15.5)
WBC: 10.6 10*3/uL — AB (ref 4.0–10.5)

## 2018-04-26 LAB — BASIC METABOLIC PANEL
BUN: 12 mg/dL (ref 6–23)
CO2: 27 mEq/L (ref 19–32)
Calcium: 10.1 mg/dL (ref 8.4–10.5)
Chloride: 101 mEq/L (ref 96–112)
Creatinine, Ser: 0.66 mg/dL (ref 0.40–1.20)
GFR: 98.21 mL/min (ref >60.00)
Glucose, Bld: 93 mg/dL (ref 70–99)
POTASSIUM: 4.1 meq/L (ref 3.5–5.1)
SODIUM: 139 meq/L (ref 135–145)

## 2018-04-26 LAB — TSH: TSH: 2.01 u[IU]/mL (ref 0.35–4.50)
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
## 2018-04-27 ENCOUNTER — Encounter: Payer: Self-pay | Admitting: *Deleted

## 2018-05-12 ENCOUNTER — Other Ambulatory Visit: Payer: Self-pay | Admitting: Family Medicine

## 2018-05-29 ENCOUNTER — Other Ambulatory Visit: Payer: Self-pay | Admitting: Family Medicine

## 2018-05-31 NOTE — Telephone Encounter (Signed)
Last office visit 04/26/2018.  Last refilled 01/04/2018 for #60 with 2 refills.  Ok to refill?

## 2018-06-28 ENCOUNTER — Telehealth: Payer: Self-pay

## 2018-06-28 NOTE — Telephone Encounter (Signed)
PLEASE NOTE: All timestamps contained within this report are represented as [place] Standard Time. CONFIDENTIALTY NOTICE: This fax transmission is intended only for the addressee. It contains information that is legally privileged, confidential or otherwise protected from use or disclosure. If you are not the intended recipient, you are strictly prohibited from reviewing, disclosing, copying using or disseminating any of this information or taking any action in reliance on or regarding this information. If you have received this fax in error, please notify us immediately by telephone so that we can arrange for its return to [place]. Phone: [phone], Toll-Free: [phone], Fax: [phone] Page: 1 of 1 Call Id: [id number] [place] Patient Name: Nichole Hogan Gender: Female DOB: 16-Oct-1961 Age: 57 Y 10 M 25 D Return Phone Number: [phone] (Primary), [phone] (Secondary) Address: City/State/Zip: [place] [address] Client [facility] Night - Client Client Site [place] Physician Owens Loffler - MD Contact Type Call Who Is Calling Patient / Member / Family / Caregiver Call Type Triage / Clinical Relationship To Patient Self Return Phone Number [phone] (Primary) Chief Complaint Medication Question (non symptomatic) Reason for Call Symptomatic / Request for [place] states she would like for Dr. Lorelei Pont to order a Cologuard test. Caller denies having Sx. Translation No Nurse Assessment Nurse: Windle Guard, RN, Olin Hauser Date/Time (Eastern Time): 06/27/2018 7:07:45 PM Confirm and document reason for call. If symptomatic, describe symptoms. ---Caller states she would like for Dr. Lorelei Pont to order a Cologuard test. States she has checked with her insurance company and it is ok to order test. Caller denies  having Sx. Declined triage. Does the patient have any new or worsening symptoms? ---No Guidelines Guideline Title Affirmed Question Affirmed Notes Nurse Date/Time (Eastern Time) Disp. Time Eilene Ghazi Time) Disposition Final User 06/27/2018 7:09:54 PM Clinical Call Yes Conner, RN, Olin Hauser

## 2018-06-28 NOTE — Telephone Encounter (Signed)
Pt had annual exam on 04/26/18.

## 2018-06-28 NOTE — Telephone Encounter (Signed)
This can be sent directly to the MD's CMA or LPN.  Thanks [employer]

## 2018-06-29 NOTE — Telephone Encounter (Signed)
Order faxed to [employer] per Dr. Lorelei Pont. Patient notified by telephone that order has been sent to the company regarding her Cologuard test and they will be in touch with her.

## 2018-08-10 LAB — COLOGUARD: Cologuard: NEGATIVE

## 2018-08-16 ENCOUNTER — Other Ambulatory Visit: Payer: Self-pay | Admitting: Family Medicine

## 2018-08-16 ENCOUNTER — Encounter: Payer: Self-pay | Admitting: Family Medicine

## 2018-12-06 ENCOUNTER — Other Ambulatory Visit: Payer: Self-pay | Admitting: Family Medicine

## 2018-12-07 NOTE — Telephone Encounter (Signed)
Last office visit 04/26/2018.  Last refilled 05/31/2018 for #60 with 2 refill. No future appointments.

## 2018-12-27 ENCOUNTER — Other Ambulatory Visit: Payer: Self-pay | Admitting: Family Medicine

## 2018-12-27 DIAGNOSIS — Z1231 Encounter for screening mammogram for malignant neoplasm of breast: Secondary | ICD-10-CM

## 2019-01-20 ENCOUNTER — Ambulatory Visit
Admission: RE | Admit: 2019-01-20 | Discharge: 2019-01-20 | Disposition: A | Discharge status: Home / Self Care | Payer: Managed Care, Other (non HMO) | Source: Ambulatory Visit | Attending: Family Medicine | Admitting: Family Medicine

## 2019-01-20 DIAGNOSIS — Z1231 Encounter for screening mammogram for malignant neoplasm of breast: Secondary | ICD-10-CM | POA: Diagnosis present

## 2019-02-10 ENCOUNTER — Encounter: Payer: Self-pay | Admitting: *Deleted

## 2019-02-10 ENCOUNTER — Other Ambulatory Visit: Payer: Self-pay | Admitting: Family Medicine

## 2019-05-08 ENCOUNTER — Other Ambulatory Visit: Payer: Self-pay | Admitting: Family Medicine

## 2019-05-09 NOTE — Telephone Encounter (Signed)
Please schedule CPE with fasting labs with Dr. Copland. 

## 2019-05-09 NOTE — Telephone Encounter (Signed)
Left message asking pt to call office  °

## 2019-05-10 MED ORDER — ALPRAZOLAM 0.5 MG PO TABS: ORAL_TABLET | ORAL | 0 refills | Status: DC

## 2019-05-10 NOTE — Telephone Encounter (Signed)
Last office visit 04/26/2018 for CPE.  Last refilled 12/07/2018 for #60 with 2 refills.  CPE scheduled 06/27/2019.

## 2019-05-10 NOTE — Telephone Encounter (Signed)
Pt called back and scheduled appt 06/27/19. Pt has a few left, also will need alprazolam.

## 2019-06-27 ENCOUNTER — Other Ambulatory Visit: Payer: Self-pay

## 2019-06-27 ENCOUNTER — Encounter: Payer: Self-pay | Admitting: Family Medicine

## 2019-06-27 ENCOUNTER — Other Ambulatory Visit (HOSPITAL_COMMUNITY)
Admission: RE | Admit: 2019-06-27 | Discharge: 2019-06-27 | Disposition: A | Discharge status: Home / Self Care | Payer: Managed Care, Other (non HMO) | Source: Ambulatory Visit | Attending: Family Medicine | Admitting: Family Medicine

## 2019-06-27 ENCOUNTER — Ambulatory Visit (INDEPENDENT_AMBULATORY_CARE_PROVIDER_SITE_OTHER): Payer: Managed Care, Other (non HMO) | Admitting: Family Medicine

## 2019-06-27 VITALS — BP 128/78 | HR 76 | Temp 98.2°F | Ht 63.5 in | Wt 125.5 lb

## 2019-06-27 DIAGNOSIS — Z124 Encounter for screening for malignant neoplasm of cervix: Secondary | ICD-10-CM | POA: Diagnosis not present

## 2019-06-27 DIAGNOSIS — Z131 Encounter for screening for diabetes mellitus: Secondary | ICD-10-CM

## 2019-06-27 DIAGNOSIS — Z0000 Encounter for general adult medical examination without abnormal findings: Secondary | ICD-10-CM | POA: Diagnosis not present

## 2019-06-27 LAB — HEPATIC FUNCTION PANEL
ALT: 21 U/L (ref 0–35)
AST: 25 U/L (ref 0–37)
Albumin: 4.7 g/dL (ref 3.5–5.2)
Alkaline Phosphatase: 62 U/L (ref 39–117)
Bilirubin, Direct: 0.2 mg/dL (ref 0.0–0.3)
Total Bilirubin: 1.2 mg/dL (ref 0.2–1.2)
Total Protein: 6.9 g/dL (ref 6.0–8.3)

## 2019-06-27 LAB — BASIC METABOLIC PANEL
BUN: 15 mg/dL (ref 6–23)
CO2: 26 mEq/L (ref 19–32)
Calcium: 9.4 mg/dL (ref 8.4–10.5)
Chloride: 103 mEq/L (ref 96–112)
Creatinine, Ser: 0.7 mg/dL (ref 0.40–1.20)
GFR: 85.98 mL/min (ref >60.00)
Glucose, Bld: 93 mg/dL (ref 70–99)
Potassium: 4.1 mEq/L (ref 3.5–5.1)
Sodium: 138 mEq/L (ref 135–145)

## 2019-06-27 LAB — TSH: TSH: 1.36 u[IU]/mL (ref 0.35–4.50)

## 2019-06-27 LAB — CBC WITH DIFFERENTIAL/PLATELET
Basophils Absolute: 0.1 10*3/uL (ref 0.0–0.1)
Basophils Relative: 0.7 % (ref 0.0–3.0)
Eosinophils Absolute: 0.3 10*3/uL (ref 0.0–0.7)
Eosinophils Relative: 3.1 % (ref 0.0–5.0)
HCT: 44.9 % (ref 36.0–46.0)
Hemoglobin: 15.6 g/dL — ABNORMAL HIGH (ref 12.0–15.0)
Lymphocytes Relative: 16.4 % (ref 12.0–46.0)
Lymphs Abs: 1.3 10*3/uL (ref 0.7–4.0)
MCHC: 34.7 g/dL (ref 30.0–36.0)
MCV: 93.6 fl (ref 78.0–100.0)
Monocytes Absolute: 0.5 10*3/uL (ref 0.1–1.0)
Monocytes Relative: 6.5 % (ref 3.0–12.0)
Neutro Abs: 6 10*3/uL (ref 1.4–7.7)
Neutrophils Relative %: 73.3 % (ref 43.0–77.0)
Platelets: 176 10*3/uL (ref 150.0–400.0)
RBC: 4.8 Mil/uL (ref 3.87–5.11)
RDW: 13.4 % (ref 11.5–15.5)
WBC: 8.1 10*3/uL (ref 4.0–10.5)

## 2019-06-27 LAB — LIPID PANEL
Cholesterol: 174 mg/dL (ref 0–200)
HDL: 82.1 mg/dL (ref >39.00)
LDL Cholesterol: 79 mg/dL (ref 0–99)
NonHDL: 92.02
Total CHOL/HDL Ratio: 2
Triglycerides: 65 mg/dL (ref 0.0–149.0)
VLDL: 13 mg/dL (ref 0.0–40.0)

## 2019-06-27 LAB — HEMOGLOBIN A1C: Hgb A1c MFr Bld: 5.3 % (ref 4.6–6.5)
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
## 2019-06-28 LAB — CYTOLOGY - PAP
Adequacy: ABSENT
Diagnosis: NEGATIVE
HPV: NOT DETECTED

## 2019-08-03 ENCOUNTER — Other Ambulatory Visit: Payer: Self-pay | Admitting: Family Medicine

## 2019-08-04 ENCOUNTER — Other Ambulatory Visit: Payer: Self-pay | Admitting: Family Medicine

## 2019-08-05 NOTE — Telephone Encounter (Signed)
Last office visit 06/27/2019 for CPE.  Last refilled 05/10/2019 for #60 with no refills.  No future appointments.

## 2019-10-02 ENCOUNTER — Other Ambulatory Visit: Payer: Self-pay | Admitting: Family Medicine

## 2019-10-03 NOTE — Telephone Encounter (Signed)
Last office visit 06/27/2019 for CPE.  Last refilled 08/05/2019 for #60 with no refills.  No future appointments.

## 2019-12-06 ENCOUNTER — Other Ambulatory Visit: Payer: Self-pay | Admitting: Family Medicine

## 2019-12-06 NOTE — Telephone Encounter (Signed)
Last office visit 06/27/2019 for CPE.  Last refilled 10/03/2019 for #60 with no refills.  No future appointments.

## 2020-01-29 ENCOUNTER — Other Ambulatory Visit: Payer: Self-pay | Admitting: Family Medicine

## 2020-02-01 ENCOUNTER — Other Ambulatory Visit: Payer: Self-pay | Admitting: Family Medicine

## 2020-02-01 NOTE — Telephone Encounter (Signed)
Last office visit 06/27/2019 for CPE.  Last refilled 12/06/2019 for #60 with no refills.  No future appointments.

## 2020-05-08 ENCOUNTER — Other Ambulatory Visit: Payer: Self-pay | Admitting: Family Medicine

## 2020-05-08 DIAGNOSIS — Z1231 Encounter for screening mammogram for malignant neoplasm of breast: Secondary | ICD-10-CM

## 2020-05-15 ENCOUNTER — Ambulatory Visit
Admission: RE | Admit: 2020-05-15 | Discharge: 2020-05-15 | Disposition: A | Discharge status: Home / Self Care | Payer: BC Managed Care – PPO | Source: Ambulatory Visit | Attending: Family Medicine | Admitting: Family Medicine

## 2020-05-15 DIAGNOSIS — Z1231 Encounter for screening mammogram for malignant neoplasm of breast: Secondary | ICD-10-CM

## 2020-05-26 ENCOUNTER — Other Ambulatory Visit: Payer: Self-pay | Admitting: Family Medicine

## 2020-05-28 NOTE — Telephone Encounter (Signed)
Last office visit 06/27/2019 for CPE.  Last refilled 02/01/2020 for #60 with 1 refill.  CPE scheduled for 07/02/2020.
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
## 2020-07-02 ENCOUNTER — Encounter: Payer: Self-pay | Admitting: Family Medicine

## 2020-07-02 ENCOUNTER — Other Ambulatory Visit: Payer: Self-pay

## 2020-07-02 ENCOUNTER — Ambulatory Visit (INDEPENDENT_AMBULATORY_CARE_PROVIDER_SITE_OTHER): Payer: BC Managed Care – PPO | Admitting: Family Medicine

## 2020-07-02 VITALS — BP 110/72 | HR 70 | Temp 97.6°F | Ht 63.0 in | Wt 121.5 lb

## 2020-07-02 DIAGNOSIS — Z131 Encounter for screening for diabetes mellitus: Secondary | ICD-10-CM | POA: Diagnosis not present

## 2020-07-02 DIAGNOSIS — Z0000 Encounter for general adult medical examination without abnormal findings: Secondary | ICD-10-CM | POA: Diagnosis not present

## 2020-07-02 DIAGNOSIS — Z79899 Other long term (current) drug therapy: Secondary | ICD-10-CM | POA: Diagnosis not present

## 2020-07-02 DIAGNOSIS — Z13220 Encounter for screening for lipoid disorders: Secondary | ICD-10-CM

## 2020-07-02 LAB — CBC WITH DIFFERENTIAL/PLATELET
Basophils Absolute: 0.1 10*3/uL (ref 0.0–0.1)
Basophils Relative: 0.8 % (ref 0.0–3.0)
Eosinophils Absolute: 0.2 10*3/uL (ref 0.0–0.7)
Eosinophils Relative: 2.5 % (ref 0.0–5.0)
HCT: 45.1 % (ref 36.0–46.0)
Hemoglobin: 15.8 g/dL — ABNORMAL HIGH (ref 12.0–15.0)
Lymphocytes Relative: 21.8 % (ref 12.0–46.0)
Lymphs Abs: 1.8 10*3/uL (ref 0.7–4.0)
MCHC: 35 g/dL (ref 30.0–36.0)
MCV: 91.8 fl (ref 78.0–100.0)
Monocytes Absolute: 0.5 10*3/uL (ref 0.1–1.0)
Monocytes Relative: 6 % (ref 3.0–12.0)
Neutro Abs: 5.8 10*3/uL (ref 1.4–7.7)
Neutrophils Relative %: 68.9 % (ref 43.0–77.0)
Platelets: 174 10*3/uL (ref 150.0–400.0)
RBC: 4.91 Mil/uL (ref 3.87–5.11)
RDW: 13.4 % (ref 11.5–15.5)
WBC: 8.5 10*3/uL (ref 4.0–10.5)

## 2020-07-02 LAB — LIPID PANEL
Cholesterol: 167 mg/dL (ref 0–200)
HDL: 86.8 mg/dL (ref >39.00)
LDL Cholesterol: 63 mg/dL (ref 0–99)
NonHDL: 80.5
Total CHOL/HDL Ratio: 2
Triglycerides: 87 mg/dL (ref 0.0–149.0)
VLDL: 17.4 mg/dL (ref 0.0–40.0)

## 2020-07-02 LAB — HEPATIC FUNCTION PANEL
ALT: 17 U/L (ref 0–35)
AST: 22 U/L (ref 0–37)
Albumin: 4.6 g/dL (ref 3.5–5.2)
Alkaline Phosphatase: 68 U/L (ref 39–117)
Bilirubin, Direct: 0.2 mg/dL (ref 0.0–0.3)
Total Bilirubin: 1.1 mg/dL (ref 0.2–1.2)
Total Protein: 7.1 g/dL (ref 6.0–8.3)

## 2020-07-02 LAB — BASIC METABOLIC PANEL
BUN: 13 mg/dL (ref 6–23)
CO2: 28 mEq/L (ref 19–32)
Calcium: 10 mg/dL (ref 8.4–10.5)
Chloride: 101 mEq/L (ref 96–112)
Creatinine, Ser: 0.69 mg/dL (ref 0.40–1.20)
GFR: 87.11 mL/min (ref >60.00)
Glucose, Bld: 86 mg/dL (ref 70–99)
Potassium: 4.2 mEq/L (ref 3.5–5.1)
Sodium: 138 mEq/L (ref 135–145)

## 2020-07-02 LAB — HEMOGLOBIN A1C: Hgb A1c MFr Bld: 5.2 % (ref 4.6–6.5)

## 2020-07-02 NOTE — Addendum Note (Signed)
Addended by: Owens Loffler on: 07/02/2020 10:10 AM   Modules accepted: Orders

## 2020-07-24 ENCOUNTER — Other Ambulatory Visit: Payer: Self-pay | Admitting: Family Medicine

## 2020-07-25 ENCOUNTER — Telehealth: Payer: Self-pay | Admitting: Family Medicine

## 2020-07-25 NOTE — Telephone Encounter (Signed)
Copy of labs mailed to patient as requested. 

## 2020-07-25 NOTE — Telephone Encounter (Signed)
Patient called in stating she would like to receive lab results for the cpe in July in the mail. Please advise.

## 2020-10-07 ENCOUNTER — Other Ambulatory Visit: Payer: Self-pay | Admitting: Family Medicine

## 2020-10-08 NOTE — Telephone Encounter (Signed)
Last office visit 07/02/2020 for CPE.  Last refilled 05/28/2020 for #60 with 1 refill.  No future appointments.

## 2020-10-17 ENCOUNTER — Other Ambulatory Visit: Payer: Self-pay

## 2020-10-17 ENCOUNTER — Ambulatory Visit: Payer: BC Managed Care – PPO | Admitting: Dermatology

## 2020-10-17 ENCOUNTER — Encounter: Payer: Self-pay | Admitting: Dermatology

## 2020-10-17 ENCOUNTER — Ambulatory Visit (INDEPENDENT_AMBULATORY_CARE_PROVIDER_SITE_OTHER): Payer: BC Managed Care – PPO | Admitting: Dermatology

## 2020-10-17 DIAGNOSIS — L821 Other seborrheic keratosis: Secondary | ICD-10-CM | POA: Diagnosis not present

## 2020-10-17 DIAGNOSIS — L820 Inflamed seborrheic keratosis: Secondary | ICD-10-CM | POA: Diagnosis not present

## 2020-10-17 DIAGNOSIS — Z85828 Personal history of other malignant neoplasm of skin: Secondary | ICD-10-CM

## 2020-10-17 DIAGNOSIS — L578 Other skin changes due to chronic exposure to nonionizing radiation: Secondary | ICD-10-CM | POA: Diagnosis not present

## 2020-10-17 NOTE — Patient Instructions (Signed)
Cryotherapy Aftercare  . Wash gently with soap and water everyday.   . Apply Vaseline and Band-Aid daily until healed.  

## 2020-10-17 NOTE — Progress Notes (Signed)
   Follow-Up Visit   Subjective  Nichole Hogan is a 59 y.o. female who presents for the following: Skin Problem (Pt presents for a growth on the back growing and itching ). She has history of squamous cell carcinoma of the chest treated in the past.  She would like her face checked.  The following portions of the chart were reviewed this encounter and updated as appropriate:  Tobacco  Allergies  Meds  Problems  Med Hx  Surg Hx  Fam Hx     Review of Systems:  No other skin or systemic complaints except as noted in HPI or Assessment and Plan.  Objective  Well appearing patient in no apparent distress; mood and affect are within normal limits.  A focused examination was performed including back . Relevant physical exam findings are noted in the Assessment and Plan.  Objective  Left Lower Back: Erythematous keratotic or waxy stuck-on papule or plaque.   Objective  Right chest: Well healed scar with no evidence of recurrence, no lymphadenopathy.    Assessment & Plan  Inflamed seborrheic keratosis Left Lower Back  Destruction of lesion - Left Lower Back Complexity: simple   Destruction method: cryotherapy   Informed consent: discussed and consent obtained   Timeout:  patient name, date of birth, surgical site, and procedure verified Lesion destroyed using liquid nitrogen: Yes   Region frozen until ice ball extended beyond lesion: Yes   Outcome: patient tolerated procedure well with no complications   Post-procedure details: wound care instructions given    History of SCC (squamous cell carcinoma) of skin Right chest  Clear. Observe for recurrence. Call clinic for new or changing lesions.  Recommend regular skin exams, daily broad-spectrum spf 30+ sunscreen use, and photoprotection.     Actinic Damage - chronic, secondary to cumulative UV radiation exposure/sun exposure over time - diffuse scaly erythematous macules with underlying dyspigmentation - Recommend daily  broad spectrum sunscreen SPF 30+ to sun-exposed areas, reapply every 2 hours as needed.  - Call for new or changing lesions.  Seborrheic Keratoses - Stuck-on, waxy, tan-brown papules and plaques  - Discussed benign etiology and prognosis. - Observe - Call for any changes   Return in about 1 year (around 10/17/2021) for Hx of SCC.  IMarye Round, CMA, am acting as scribe for [name], MD . Documentation: I have reviewed the above documentation for accuracy and completeness, and I agree with the above.  [name], MD

## 2021-01-11 ENCOUNTER — Other Ambulatory Visit: Payer: Self-pay | Admitting: Family Medicine

## 2021-01-11 NOTE — Telephone Encounter (Signed)
Last office visit 07/02/2020 for CPE.  Last refilled 10/08/2020 for #60 with 1 refill.  No future appointments with PCP.

## 2021-01-23 ENCOUNTER — Other Ambulatory Visit: Payer: Self-pay | Admitting: Family Medicine

## 2021-04-29 ENCOUNTER — Other Ambulatory Visit: Payer: Self-pay | Admitting: Family Medicine

## 2021-04-30 NOTE — Telephone Encounter (Signed)
Last office visit 07/02/2020 for CPE.  Last refilled 01/11/2021 for #60 with 1 refill.  No future appointments with PCP.

## 2021-06-03 ENCOUNTER — Telehealth: Payer: Self-pay | Admitting: *Deleted

## 2021-06-03 ENCOUNTER — Other Ambulatory Visit: Payer: Self-pay

## 2021-06-03 ENCOUNTER — Ambulatory Visit
Admission: EM | Admit: 2021-06-03 | Discharge: 2021-06-03 | Disposition: A | Discharge status: Home / Self Care | Payer: BC Managed Care – PPO | Attending: Emergency Medicine | Admitting: Emergency Medicine

## 2021-06-03 ENCOUNTER — Encounter: Payer: Self-pay | Admitting: Emergency Medicine

## 2021-06-03 ENCOUNTER — Ambulatory Visit (INDEPENDENT_AMBULATORY_CARE_PROVIDER_SITE_OTHER): Payer: BC Managed Care – PPO

## 2021-06-03 DIAGNOSIS — L03012 Cellulitis of left finger: Secondary | ICD-10-CM

## 2021-06-03 DIAGNOSIS — R030 Elevated blood-pressure reading, without diagnosis of hypertension: Secondary | ICD-10-CM | POA: Diagnosis not present

## 2021-06-03 DIAGNOSIS — M7989 Other specified soft tissue disorders: Secondary | ICD-10-CM | POA: Diagnosis not present

## 2021-06-03 MED ORDER — SULFAMETHOXAZOLE-TRIMETHOPRIM 800-160 MG PO TABS: 1.0000 | ORAL_TABLET | Freq: Two times a day (BID) | ORAL | 0 refills | Status: AC

## 2021-06-03 NOTE — Telephone Encounter (Signed)
Spoke to patient by telephone and was advised that she was bitten by something last week. Patient stated that her index finger on her left hand was red so she used neosporin on it. Patient stated that she thought her finger was getting better but now is concerned that it may be infected. Patient denies a fever. Patient stated that she wants someone to look at her finger today. Patient was advised that we don't have any appointments available today at the office. Patient was offered an appointment at another [facility] office which she declined. Patient stated that he would rather go to a local UC. Patient was given information on the [facility]. Patient stated that she will go to the UC shortly.

## 2021-06-03 NOTE — ED Triage Notes (Signed)
Pt presents today with c/o of pain/swelling to left 2nd finger x 1 week. Denies injury.

## 2021-06-03 NOTE — Telephone Encounter (Signed)
Agree.  Definitely needs to be seen today.

## 2021-06-03 NOTE — Telephone Encounter (Signed)
PLEASE NOTE: All timestamps contained within this report are represented as [place] Standard Time. CONFIDENTIALTY NOTICE: This fax transmission is intended only for the addressee. It contains information that is legally privileged, confidential or otherwise protected from use or disclosure. If you are not the intended recipient, you are strictly prohibited from reviewing, disclosing, copying using or disseminating any of this information or taking any action in reliance on or regarding this information. If you have received this fax in error, please notify us immediately by telephone so that we can arrange for its return to [place]. Phone: [phone], Toll-Free: [phone], Fax: [phone] Page: 1 of 1 Call Id: [id number] [facility] - Client Nonclinical Telephone Record  [facility] Client [facility] - Client Client Site [facility] Physician Owens Loffler - MD Contact Type Call Who Is Calling Patient / Member / Family / Caregiver Caller Name Nichole Hogan Caller Phone Number [phone] Patient Name Nichole Hogan Patient DOB [birth year] Call Type Message Only Information Provided Reason for Call Request to Schedule Office Appointment Initial Comment Caller wants to get her finger checked today. Nurse triage was offered and caller declined. Provided caller with office hours. Patient request to speak to RN No Disp. Time Disposition Final User 06/03/2021 8:07:11 AM General Information Provided Yes [place], [place] Call Closed By: Shireen Quan Transaction Date/Time: 06/03/2021 8:03:25 AM (ET)

## 2021-06-03 NOTE — Discharge Instructions (Addendum)
Take the Bactrim as directed.  Schedule an appointment with your primary care provider for recheck of your finger in 1 week.  Your blood pressure is elevated today at 161/78.  Please have this rechecked by your primary care provider in 2-4 weeks.
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
## 2021-06-09 DIAGNOSIS — L089 Local infection of the skin and subcutaneous tissue, unspecified: Secondary | ICD-10-CM | POA: Diagnosis not present

## 2021-06-16 ENCOUNTER — Encounter: Payer: Self-pay | Admitting: Family Medicine

## 2021-06-16 NOTE — Progress Notes (Signed)
Nichole Virgen T. Luree Palla, MD, [place] at [place] [place], [address]  Phone: [phone]  FAX: [phone]  Nichole Hogan - 60 y.o. female  MRN [id number]  Date of Birth: 1961-04-11  Date: 06/17/2021  PCP: Owens Loffler, MD  Referral: Owens Loffler, MD  Chief Complaint  Patient presents with  . Follow-up    Urgent Care Visit-Elevated BP & Cellulitis Left Index Finger    This visit occurred during the SARS-CoV-2 public health emergency.  Safety protocols were in place, including screening questions prior to the visit, additional usage of staff PPE, and extensive cleaning of exam room while observing appropriate contact time as indicated for disinfecting solutions.   Subjective:   Nichole Hogan is a 60 y.o. very pleasant female patient with Body mass index is 22.05 kg/m. who presents with the following:  She is here for an urgent care follow-up.  She was seen on June 03, 2021, at that point she was diagnosed with cellulitis of the left finger, placed on Bactrim.  She also was noted to have some elevated blood pressures.  She completed her antibiotic yesterday, and she still has some persistent swelling and redness as depicted below.  Predominant pain now is at the DIP joint.  She has pain with flexion and extension at the DIP joint, but not at the PIP and MCP joint.   BP Readings from Last 3 Encounters:  06/17/21 140/74  06/03/21 (!) 161/78  07/02/20 110/72    She also thinks that she has some oral thrush on her tongue.  Review of Systems is noted in the HPI, as appropriate  Objective:   BP 140/74   Pulse 71   Temp (!) 97.2 F (36.2 C) (Temporal)   Ht 5\' 3"  (1.6 m)   Wt 124 lb 8 oz (56.5 kg)   LMP 11/03/2012   SpO2 97%   BMI 22.05 kg/m   GEN: No acute distress; alert,appropriate. PULM: Breathing comfortably in no respiratory distress PSYCH: Normally interactive.       Whitish coloration  on tongue.  Incomplete flexion and extension at the DIP joint on the left.  Second digit.  Laboratory and Imaging Data:  Assessment and Plan:     ICD-10-CM   1. Infection of distal interphalangeal (DIP) joint of finger (HCC)  M00.9 Ambulatory referral to Hand Surgery    2. Cellulitis of left finger  L03.012 Ambulatory referral to Hand Surgery    3. Elevated blood pressure reading  R03.0     4. Thrush, oral  B37.0      Clinical appearance based on exam with imaging above of incompletely treated finger infection.  Pain and swelling and redness are concentrated at the DIP joint.  She is unable to form a complete composite fist with bending at the DIP joint.  Change antibiotics to doxycycline.  I am worried that this is not been completely treated and in a finger, this is potentially a very serious problem.  I am going to place an ASAP appointment with hand surgery to get them involved now.  Blood pressure is stable.  No concern.  Oral thrush, nystatin swish and swallow.  Meds ordered this encounter  Medications  . salmeterol (SEREVENT DISKUS) 50 MCG/DOSE diskus inhaler    Sig: INHALE 1 PUFF INTO THE LUNGS EVERY 12 (TWELVE) HOURS.    Dispense:  3 each    Refill:  3  . ALPRAZolam (XANAX) 0.5 MG tablet  Sig: Take 1 tablet by mouth three times a day as needed for anxiety    Dispense:  60 tablet    Refill:  1  . doxycycline (VIBRA-TABS) 100 MG tablet    Sig: Take 1 tablet (100 mg total) by mouth 2 (two) times daily.    Dispense:  20 tablet    Refill:  0  . nystatin (MYCOSTATIN) 100000 UNIT/ML suspension    Sig: Take 5 mLs (500,000 Units total) by mouth 4 (four) times daily.    Dispense:  473 mL    Refill:  0   Medications Discontinued During This Encounter  Medication Reason  . SEREVENT DISKUS 50 MCG/DOSE diskus inhaler Reorder  . ALPRAZolam (XANAX) 0.5 MG tablet Reorder   Orders Placed This Encounter  Procedures  . Ambulatory referral to Hand Surgery    Follow-up: No  follow-ups on file.  Signed,  Maud Deed. Cathy Crounse, MD   Outpatient Encounter Medications as of 06/17/2021  Medication Sig  . citalopram (CELEXA) 40 MG tablet TAKE 1 TABLET BY MOUTH EVERY DAY  . doxycycline (VIBRA-TABS) 100 MG tablet Take 1 tablet (100 mg total) by mouth 2 (two) times daily.  Marland Kitchen nystatin (MYCOSTATIN) 100000 UNIT/ML suspension Take 5 mLs (500,000 Units total) by mouth 4 (four) times daily.  . [DISCONTINUED] ALPRAZolam (XANAX) 0.5 MG tablet TAKE 1 TABLET BY MOUTH THREE TIMES A DAY AS NEEDED FOR ANXIETY  . [DISCONTINUED] SEREVENT DISKUS 50 MCG/DOSE diskus inhaler INHALE 1 PUFF INTO THE LUNGS EVERY 12 (TWELVE) HOURS.  Marland Kitchen ALPRAZolam (XANAX) 0.5 MG tablet Take 1 tablet by mouth three times a day as needed for anxiety  . salmeterol (SEREVENT DISKUS) 50 MCG/DOSE diskus inhaler INHALE 1 PUFF INTO THE LUNGS EVERY 12 (TWELVE) HOURS.   No facility-administered encounter medications on file as of 06/17/2021.

## 2021-06-17 ENCOUNTER — Encounter: Payer: Self-pay | Admitting: Family Medicine

## 2021-06-17 ENCOUNTER — Ambulatory Visit (INDEPENDENT_AMBULATORY_CARE_PROVIDER_SITE_OTHER): Payer: BC Managed Care – PPO | Admitting: Family Medicine

## 2021-06-17 ENCOUNTER — Other Ambulatory Visit: Payer: Self-pay

## 2021-06-17 VITALS — BP 140/74 | HR 71 | Temp 97.2°F | Ht 63.0 in | Wt 124.5 lb

## 2021-06-17 DIAGNOSIS — L03012 Cellulitis of left finger: Secondary | ICD-10-CM

## 2021-06-17 DIAGNOSIS — B370 Candidal stomatitis: Secondary | ICD-10-CM

## 2021-06-17 DIAGNOSIS — M009 Pyogenic arthritis, unspecified: Secondary | ICD-10-CM | POA: Diagnosis not present

## 2021-06-17 DIAGNOSIS — R030 Elevated blood-pressure reading, without diagnosis of hypertension: Secondary | ICD-10-CM

## 2021-06-17 MED ORDER — ALPRAZOLAM 0.5 MG PO TABS: ORAL_TABLET | ORAL | 1 refills | Status: DC

## 2021-06-17 MED ORDER — NYSTATIN 100000 UNIT/ML MT SUSP: 5.0000 mL | Freq: Four times a day (QID) | OROMUCOSAL | 0 refills | Status: DC

## 2021-06-17 MED ORDER — SEREVENT DISKUS 50 MCG/DOSE IN AEPB: INHALATION_SPRAY | RESPIRATORY_TRACT | 3 refills | Status: DC

## 2021-06-17 MED ORDER — DOXYCYCLINE HYCLATE 100 MG PO TABS: 100.0000 mg | ORAL_TABLET | Freq: Two times a day (BID) | ORAL | 0 refills | Status: DC

## 2021-06-25 DIAGNOSIS — M20012 Mallet finger of left finger(s): Secondary | ICD-10-CM | POA: Diagnosis not present

## 2021-07-16 DIAGNOSIS — M20012 Mallet finger of left finger(s): Secondary | ICD-10-CM | POA: Diagnosis not present

## 2021-07-24 ENCOUNTER — Ambulatory Visit (INDEPENDENT_AMBULATORY_CARE_PROVIDER_SITE_OTHER): Payer: BC Managed Care – PPO | Admitting: Dermatology

## 2021-07-24 ENCOUNTER — Other Ambulatory Visit: Payer: Self-pay

## 2021-07-24 DIAGNOSIS — C4492 Squamous cell carcinoma of skin, unspecified: Secondary | ICD-10-CM

## 2021-07-24 DIAGNOSIS — L578 Other skin changes due to chronic exposure to nonionizing radiation: Secondary | ICD-10-CM

## 2021-07-24 DIAGNOSIS — D485 Neoplasm of uncertain behavior of skin: Secondary | ICD-10-CM

## 2021-07-24 DIAGNOSIS — C44629 Squamous cell carcinoma of skin of left upper limb, including shoulder: Secondary | ICD-10-CM

## 2021-07-24 HISTORY — DX: Squamous cell carcinoma of skin, unspecified: C44.92

## 2021-07-24 NOTE — Progress Notes (Signed)
   Follow-Up Visit   Subjective  Nichole Hogan is a 60 y.o. female who presents for the following: Other (Spot of left forearm x ~1 month).  The following portions of the chart were reviewed this encounter and updated as appropriate:   Tobacco  Allergies  Meds  Problems  Med Hx  Surg Hx  Fam Hx     Review of Systems:  No other skin or systemic complaints except as noted in HPI or Assessment and Plan.  Objective  Well appearing patient in no apparent distress; mood and affect are within normal limits.  A focused examination was performed including left arm. Relevant physical exam findings are noted in the Assessment and Plan.  Left Forearm - Posterior 1.5 cm crusted papule   Assessment & Plan   Actinic Damage - chronic, secondary to cumulative UV radiation exposure/sun exposure over time - diffuse scaly erythematous macules with underlying dyspigmentation - Recommend daily broad spectrum sunscreen SPF 30+ to sun-exposed areas, reapply every 2 hours as needed.  - Recommend staying in the shade or wearing long sleeves, sun glasses (UVA+UVB protection) and wide brim hats (4-inch brim around the entire circumference of the hat). - Call for new or changing lesions.  Neoplasm of uncertain behavior of skin Left Forearm - Posterior  Epidermal / dermal shaving  Lesion diameter (cm):  1.5 Informed consent: discussed and consent obtained   Timeout: patient name, date of birth, surgical site, and procedure verified   Procedure prep:  Patient was prepped and draped in usual sterile fashion Prep type:  Isopropyl alcohol Anesthesia: the lesion was anesthetized in a standard fashion   Anesthetic:  1% lidocaine w/ epinephrine 1-100,000 buffered w/ 8.4% NaHCO3 Instrument used: flexible razor blade   Hemostasis achieved with: pressure, aluminum chloride and electrodesiccation   Outcome: patient tolerated procedure well   Post-procedure details: sterile dressing applied and wound care  instructions given   Dressing type: bandage and petrolatum    Destruction of lesion Complexity: extensive   Destruction method: electrodesiccation and curettage   Informed consent: discussed and consent obtained   Timeout:  patient name, date of birth, surgical site, and procedure verified Procedure prep:  Patient was prepped and draped in usual sterile fashion Prep type:  Isopropyl alcohol Anesthesia: the lesion was anesthetized in a standard fashion   Anesthetic:  1% lidocaine w/ epinephrine 1-100,000 buffered w/ 8.4% NaHCO3 Curettage performed in three different directions: Yes   Electrodesiccation performed over the curetted area: Yes   Lesion length (cm):  1.5 Lesion width (cm):  1.5 Margin per side (cm):  0.2 Final wound size (cm):  1.9 Hemostasis achieved with:  pressure and aluminum chloride Outcome: patient tolerated procedure well with no complications   Post-procedure details: sterile dressing applied and wound care instructions given   Dressing type: bandage and petrolatum    Specimen 1 - Surgical pathology Differential Diagnosis: SCC vs other  Check Margins: No EDC today  Return in about 3 months (around 10/24/2021) for TBSE.  I, Ashok Cordia, CMA, am acting as scribe for [name], MD . Documentation: I have reviewed the above documentation for accuracy and completeness, and I agree with the above.  [name], MD

## 2021-07-24 NOTE — Patient Instructions (Signed)
Wound Care Instructions  Cleanse wound gently with soap and water once a day then pat dry with clean gauze. Apply a thing coat of Petrolatum (petroleum jelly, "Vaseline") over the wound (unless you have an allergy to this). We recommend that you use a new, sterile tube of Vaseline. Do not pick or remove scabs. Do not remove the yellow or white "healing tissue" from the base of the wound.  Cover the wound with fresh, clean, nonstick gauze and secure with paper tape. You may use Band-Aids in place of gauze and tape if the would is small enough, but would recommend trimming much of the tape off as there is often too much. Sometimes Band-Aids can irritate the skin.  You should call the office for your biopsy report after 1 week if you have not already been contacted.  If you experience any problems, such as abnormal amounts of bleeding, swelling, significant bruising, significant pain, or evidence of infection, please call the office immediately.  FOR ADULT SURGERY PATIENTS: If you need something for pain relief you may take 1 extra strength Tylenol (acetaminophen) AND 2 Ibuprofen (200mg each) together every 4 hours as needed for pain. (do not take these if you are allergic to them or if you have a reason you should not take them.) Typically, you may only need pain medication for 1 to 3 days.   If you have any questions or concerns for your doctor, please call our main line at [phone] and press option 4 to reach your doctor's medical assistant. If no one answers, please leave a voicemail as directed and we will return your call as soon as possible. Messages left after 4 pm will be answered the following business day.   You may also send us a message via MyChart. We typically respond to MyChart messages within 1-2 business days.  For prescription refills, please ask your pharmacy to contact our office. Our fax number is [phone].  If you have an urgent issue when the clinic is closed that  cannot wait until the next business day, you can page your doctor at the number below.    Please note that while we do our best to be available for urgent issues outside of office hours, we are not available 24/7.   If you have an urgent issue and are unable to reach us, you may choose to seek medical care at your doctor's office, retail clinic, urgent care center, or emergency room.  If you have a medical emergency, please immediately call 911 or go to the emergency department.  Pager Numbers  - Dr. Kowalski: [phone]  - Dr. Moye: [phone]  - Dr. Stewart: [phone]  In the event of inclement weather, please call our main line at [phone] for an update on the status of any delays or closures.  Dermatology Medication Tips: Please keep the boxes that topical medications come in in order to help keep track of the instructions about where and how to use these. Pharmacies typically print the medication instructions only on the boxes and not directly on the medication tubes.   If your medication is too expensive, please contact our office at [phone] option 4 or send us a message through MyChart.   We are unable to tell what your co-pay for medications will be in advance as this is different depending on your insurance coverage. However, we may be able to find a substitute medication at lower cost or fill out paperwork to get insurance to cover a needed   medication.   If a prior authorization is required to get your medication covered by your insurance company, please allow us 1-2 business days to complete this process.  Drug prices often vary depending on where the prescription is filled and some pharmacies may offer cheaper prices.  The website www.goodrx.com contains coupons for medications through different pharmacies. The prices here do not account for what the cost may be with help from insurance (it may be cheaper with your insurance), but the website can give you the  price if you did not use any insurance.  - You can print the associated coupon and take it with your prescription to the pharmacy.  - You may also stop by our office during regular business hours and pick up a GoodRx coupon card.  - If you need your prescription sent electronically to a different pharmacy, notify our office through  MyChart or by phone at [phone] option 4.   

## 2021-07-26 ENCOUNTER — Telehealth: Payer: Self-pay | Admitting: Family Medicine

## 2021-07-26 NOTE — Telephone Encounter (Signed)
Please schedule CPE with fasting labs prior with Dr. Copland.  

## 2021-07-26 NOTE — Telephone Encounter (Signed)
Spoke with patient scheduled CPE . Patient is requesting same day labs . Unable to get off work to do labs prior

## 2021-07-28 ENCOUNTER — Encounter: Payer: Self-pay | Admitting: Dermatology

## 2021-08-01 ENCOUNTER — Telehealth: Payer: Self-pay

## 2021-08-01 NOTE — Telephone Encounter (Signed)
Advised patient of results/hd  

## 2021-08-01 NOTE — Telephone Encounter (Signed)
-----   Message from Ralene Bathe, MD sent at 07/31/2021  2:28 PM EDT ----- Skin , left forearm - posterior SQUAMOUS CELL CARCINOMA, KERATOACANTHOMA TYPE, BASE INVOLVED  Cancer - SCC Already treated Recheck next visit

## 2021-08-07 DIAGNOSIS — M20012 Mallet finger of left finger(s): Secondary | ICD-10-CM | POA: Diagnosis not present

## 2021-09-04 ENCOUNTER — Other Ambulatory Visit: Payer: Self-pay

## 2021-09-04 ENCOUNTER — Ambulatory Visit (INDEPENDENT_AMBULATORY_CARE_PROVIDER_SITE_OTHER): Payer: BC Managed Care – PPO | Admitting: Family Medicine

## 2021-09-04 ENCOUNTER — Encounter: Payer: Self-pay | Admitting: Family Medicine

## 2021-09-04 VITALS — BP 160/90 | HR 76 | Temp 97.6°F | Ht 63.0 in | Wt 125.1 lb

## 2021-09-04 DIAGNOSIS — Z0000 Encounter for general adult medical examination without abnormal findings: Secondary | ICD-10-CM

## 2021-09-04 DIAGNOSIS — Z131 Encounter for screening for diabetes mellitus: Secondary | ICD-10-CM

## 2021-09-04 DIAGNOSIS — R5383 Other fatigue: Secondary | ICD-10-CM

## 2021-09-04 DIAGNOSIS — Z13220 Encounter for screening for lipoid disorders: Secondary | ICD-10-CM

## 2021-09-04 DIAGNOSIS — Z1211 Encounter for screening for malignant neoplasm of colon: Secondary | ICD-10-CM | POA: Diagnosis not present

## 2021-09-04 LAB — CBC WITH DIFFERENTIAL/PLATELET
Basophils Absolute: 0.1 10*3/uL (ref 0.0–0.1)
Basophils Relative: 1 % (ref 0.0–3.0)
Eosinophils Absolute: 0.2 10*3/uL (ref 0.0–0.7)
Eosinophils Relative: 2.4 % (ref 0.0–5.0)
HCT: 45.6 % (ref 36.0–46.0)
Hemoglobin: 15.7 g/dL — ABNORMAL HIGH (ref 12.0–15.0)
Lymphocytes Relative: 17.9 % (ref 12.0–46.0)
Lymphs Abs: 1.3 10*3/uL (ref 0.7–4.0)
MCHC: 34.3 g/dL (ref 30.0–36.0)
MCV: 92.2 fl (ref 78.0–100.0)
Monocytes Absolute: 0.4 10*3/uL (ref 0.1–1.0)
Monocytes Relative: 6.1 % (ref 3.0–12.0)
Neutro Abs: 5.2 10*3/uL (ref 1.4–7.7)
Neutrophils Relative %: 72.6 % (ref 43.0–77.0)
Platelets: 189 10*3/uL (ref 150.0–400.0)
RBC: 4.95 Mil/uL (ref 3.87–5.11)
RDW: 13 % (ref 11.5–15.5)
WBC: 7.1 10*3/uL (ref 4.0–10.5)

## 2021-09-04 LAB — HEPATIC FUNCTION PANEL
ALT: 19 U/L (ref 0–35)
AST: 20 U/L (ref 0–37)
Albumin: 5.1 g/dL (ref 3.5–5.2)
Alkaline Phosphatase: 66 U/L (ref 39–117)
Bilirubin, Direct: 0.2 mg/dL (ref 0.0–0.3)
Total Bilirubin: 1.2 mg/dL (ref 0.2–1.2)
Total Protein: 7.5 g/dL (ref 6.0–8.3)

## 2021-09-04 LAB — LIPID PANEL
Cholesterol: 184 mg/dL (ref 0–200)
HDL: 93.7 mg/dL (ref >39.00)
LDL Cholesterol: 72 mg/dL (ref 0–99)
NonHDL: 90.72
Total CHOL/HDL Ratio: 2
Triglycerides: 93 mg/dL (ref 0.0–149.0)
VLDL: 18.6 mg/dL (ref 0.0–40.0)

## 2021-09-04 LAB — BASIC METABOLIC PANEL
BUN: 16 mg/dL (ref 6–23)
CO2: 30 mEq/L (ref 19–32)
Calcium: 10.4 mg/dL (ref 8.4–10.5)
Chloride: 101 mEq/L (ref 96–112)
Creatinine, Ser: 0.63 mg/dL (ref 0.40–1.20)
GFR: 96.64 mL/min (ref >60.00)
Glucose, Bld: 103 mg/dL — ABNORMAL HIGH (ref 70–99)
Potassium: 4.7 mEq/L (ref 3.5–5.1)
Sodium: 140 mEq/L (ref 135–145)

## 2021-09-04 LAB — HEMOGLOBIN A1C: Hgb A1c MFr Bld: 5.4 % (ref 4.6–6.5)

## 2021-09-04 LAB — TSH: TSH: 1.74 u[IU]/mL (ref 0.35–5.50)
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
## 2021-09-13 ENCOUNTER — Other Ambulatory Visit: Payer: Self-pay | Admitting: Family Medicine

## 2021-09-24 DIAGNOSIS — Z1211 Encounter for screening for malignant neoplasm of colon: Secondary | ICD-10-CM | POA: Diagnosis not present

## 2021-09-29 LAB — COLOGUARD: Cologuard: NEGATIVE

## 2021-10-21 ENCOUNTER — Ambulatory Visit: Payer: BC Managed Care – PPO | Admitting: Dermatology

## 2021-10-23 ENCOUNTER — Other Ambulatory Visit: Payer: Self-pay

## 2021-10-23 ENCOUNTER — Encounter: Payer: Self-pay | Admitting: Family Medicine

## 2021-10-23 ENCOUNTER — Ambulatory Visit (INDEPENDENT_AMBULATORY_CARE_PROVIDER_SITE_OTHER): Payer: BC Managed Care – PPO | Admitting: Family Medicine

## 2021-10-23 VITALS — BP 120/72 | HR 74 | Temp 97.1°F | Ht 63.0 in | Wt 126.2 lb

## 2021-10-23 DIAGNOSIS — R52 Pain, unspecified: Secondary | ICD-10-CM | POA: Diagnosis not present

## 2021-10-23 DIAGNOSIS — H6591 Unspecified nonsuppurative otitis media, right ear: Secondary | ICD-10-CM | POA: Diagnosis not present

## 2021-10-23 DIAGNOSIS — H65111 Acute and subacute allergic otitis media (mucoid) (sanguinous) (serous), right ear: Secondary | ICD-10-CM

## 2021-10-23 DIAGNOSIS — J988 Other specified respiratory disorders: Secondary | ICD-10-CM

## 2021-10-23 DIAGNOSIS — Z20822 Contact with and (suspected) exposure to covid-19: Secondary | ICD-10-CM

## 2021-10-23 DIAGNOSIS — H7291 Unspecified perforation of tympanic membrane, right ear: Secondary | ICD-10-CM

## 2021-10-23 DIAGNOSIS — J441 Chronic obstructive pulmonary disease with (acute) exacerbation: Secondary | ICD-10-CM

## 2021-10-23 LAB — POC INFLUENZA A&B (BINAX/QUICKVUE)
Influenza A, POC: NEGATIVE
Influenza B, POC: NEGATIVE

## 2021-10-23 MED ORDER — OFLOXACIN 0.3 % OT SOLN: OTIC | 0 refills | Status: DC

## 2021-10-23 MED ORDER — AZITHROMYCIN 250 MG PO TABS: ORAL_TABLET | ORAL | 0 refills | Status: AC

## 2021-10-23 NOTE — Progress Notes (Signed)
[name], MD, [place] at [facility] [place], [address]  Phone: [phone]  FAX: [phone]  Nichole Hogan - 60 y.o. female  MRN [id number]  Date of Birth: 01-May-1961  Date: 10/23/2021  PCP: Owens Loffler, MD  Referral: Owens Loffler, MD  Chief Complaint  Patient presents with   Ear Pain    Right-Felt a pop and then fluid started draining out of ear    This visit occurred during the SARS-CoV-2 public health emergency.  Safety protocols were in place, including screening questions prior to the visit, additional usage of staff PPE, and extensive cleaning of exam room while observing appropriate contact time as indicated for disinfecting solutions.   Subjective:   Nichole Hogan is a 60 y.o. very pleasant female patient with Body mass index is 22.36 kg/m. who presents with the following:  Sat woke up and felt fine. Lunch time felt like she was aching all over.   Went to work ok Monday. Started to have some congestion and in her head. She has not done any at-home COVID testing.  Inhaler did help some. Ear popped and was very painful.  She subsequently had some discharge. Continues to have some wheezing in coughing.  Drained some on the R side.   She does not have any fever, she has not any nausea, vomiting, diarrhea. Eating appropriately.  Review of Systems is noted in the HPI, as appropriate  Objective:   BP 120/72   Pulse 74   Temp (!) 97.1 F (36.2 C) (Temporal)   Ht 5\' 3"  (1.6 m)   Wt 126 lb 4 oz (57.3 kg)   LMP 11/03/2012   SpO2 97%   BMI 22.36 kg/m    Gen: WDWN, cooperative. Globally Non-toxic HEENT: Normocephalic and atraumatic. Throat clear, w/o exudate, R ear canal with some blood.  The TM is bulging and I cannot see any landmarks.  I do not appreciate a hole., L TM - good landmarks, No fluid present. rhinnorhea. No frontal or maxillary sinus T. MMM NECK: Anterior  cervical  LAD is present CV: RRR, No M/G/R, cap refill <2 sec PULM: Breathing comfortably in no respiratory distress.  She does have an end expiratory wheeze bilaterally. ABD: S,NT,ND,+BS. No HSM. No rebound. MSK: Nml gait    Laboratory and Imaging Data: Results for orders placed or performed in visit on 10/23/21  POC Influenza A&B (Binax test)  Result Value Ref Range   Influenza A, POC Negative Negative   Influenza B, POC Negative Negative     Assessment and Plan:     ICD-10-CM   1. Acute mucoid otitis media of right ear  H65.111     2. Otitis media, serous, tm rupture, right  H65.91    H72.91     3. Body aches  R52 Novel Coronavirus, NAA ([facility])    POC Influenza A&B (Binax test)    Novel Coronavirus, NAA ([facility])    CANCELED: POC Influenza A&B(BINAX/QUICKVUE)    4. Congestion of respiratory tract  J98.8 Novel Coronavirus, NAA ([facility])    POC Influenza A&B (Binax test)    Novel Coronavirus, NAA ([facility])    CANCELED: POC Influenza A&B(BINAX/QUICKVUE)    5. COPD exacerbation ([place])  J44.1     6. Suspected COVID-19 virus infection  Q22.297      She certainly has an ear infection and did have a small TM rupture, do not see a hole in the eardrum,  but she has an obvious middle ear infection.  I am going placed the patient on Zithromax given her penicillin allergy.  With a TM rupture, also going to give her some Floxin otic.  Influenza test is negative.  Cannot exclude COVID-19 given onset of symptoms, and a PCR at this point would be extremely accurate.  She does have some COPD, she is having some wheezing right now.  Continue to use her inhaler.  If her COVID comes back positive then I would put the patient on some antivirals.  Meds ordered this encounter  Medications   azithromycin (ZITHROMAX) 250 MG tablet    Sig: Take 2 tablets (500 mg total) by mouth daily for 1 day, THEN 1 tablet (250 mg total) daily for 4 days.    Dispense:  6 tablet    Refill:  0   ofloxacin  (FLOXIN) 0.3 % OTIC solution    Sig: 10 drops in the R ear once daily for 1 week    Dispense:  5 mL    Refill:  0   Medications Discontinued During This Encounter  Medication Reason   nystatin (MYCOSTATIN) 100000 UNIT/ML suspension Completed Course   Orders Placed This Encounter  Procedures   Novel Coronavirus, NAA ([facility])   POC Influenza A&B (Binax test)    Follow-up: No follow-ups on file.  Dragon [employer] One speech-to-text software was used for transcription in this dictation.  Possible transcriptional errors can occur using Editor, commissioning.   Signed,  Maud Deed. Acel Natzke, MD   Outpatient Encounter Medications as of 10/23/2021  Medication Sig   ALPRAZolam (XANAX) 0.5 MG tablet Take 1 tablet by mouth three times a day as needed for anxiety   azithromycin (ZITHROMAX) 250 MG tablet Take 2 tablets (500 mg total) by mouth daily for 1 day, THEN 1 tablet (250 mg total) daily for 4 days.   citalopram (CELEXA) 40 MG tablet TAKE 1 TABLET BY MOUTH EVERY DAY   ofloxacin (FLOXIN) 0.3 % OTIC solution 10 drops in the R ear once daily for 1 week   salmeterol (SEREVENT DISKUS) 50 MCG/DOSE diskus inhaler INHALE 1 PUFF INTO THE LUNGS EVERY 12 (TWELVE) HOURS.   [DISCONTINUED] nystatin (MYCOSTATIN) 100000 UNIT/ML suspension TAKE 5 MLS (500,000 UNITS TOTAL) BY MOUTH 4 (FOUR) TIMES DAILY.   No facility-administered encounter medications on file as of 10/23/2021.

## 2021-10-24 ENCOUNTER — Ambulatory Visit: Payer: BC Managed Care – PPO | Admitting: Dermatology

## 2021-10-24 LAB — NOVEL CORONAVIRUS, NAA: SARS-CoV-2, NAA: NOT DETECTED

## 2021-10-24 LAB — SARS-COV-2, NAA 2 DAY TAT

## 2021-11-10 ENCOUNTER — Other Ambulatory Visit: Payer: Self-pay | Admitting: Family Medicine

## 2021-11-10 NOTE — Telephone Encounter (Signed)
Last office visit  10/23/21 for ear pain.  Last refilled 06/17/21 for #60 with 1 refill.  No future appointments.

## 2022-03-09 ENCOUNTER — Other Ambulatory Visit: Payer: Self-pay | Admitting: Family Medicine

## 2022-03-09 NOTE — Telephone Encounter (Signed)
Last office visit 10/23/21 for ear pain.  Last refilled 11/11/21 for #60 with 1 refill.  No future appointments.  ?

## 2022-06-17 ENCOUNTER — Other Ambulatory Visit: Payer: Self-pay | Admitting: Family Medicine

## 2022-06-17 DIAGNOSIS — Z1231 Encounter for screening mammogram for malignant neoplasm of breast: Secondary | ICD-10-CM

## 2022-07-08 ENCOUNTER — Other Ambulatory Visit: Payer: Self-pay | Admitting: Family Medicine

## 2022-07-09 NOTE — Telephone Encounter (Signed)
Last office visit 10/23/21 for ear pain.  Last refilled 03/10/22 for #60 with 1 refill.  CPE scheduled 09/08/22.

## 2022-07-11 ENCOUNTER — Ambulatory Visit
Admission: RE | Admit: 2022-07-11 | Discharge: 2022-07-11 | Disposition: A | Discharge status: Home / Self Care | Payer: BC Managed Care – PPO | Source: Ambulatory Visit | Attending: Family Medicine | Admitting: Family Medicine

## 2022-07-11 DIAGNOSIS — Z1231 Encounter for screening mammogram for malignant neoplasm of breast: Secondary | ICD-10-CM | POA: Insufficient documentation
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
## 2022-09-08 ENCOUNTER — Ambulatory Visit (INDEPENDENT_AMBULATORY_CARE_PROVIDER_SITE_OTHER): Payer: BC Managed Care – PPO | Admitting: Family Medicine

## 2022-09-08 ENCOUNTER — Encounter: Payer: Self-pay | Admitting: Family Medicine

## 2022-09-08 VITALS — BP 140/82 | HR 70 | Temp 98.0°F | Ht 63.0 in | Wt 126.5 lb

## 2022-09-08 DIAGNOSIS — R5383 Other fatigue: Secondary | ICD-10-CM | POA: Diagnosis not present

## 2022-09-08 DIAGNOSIS — Z79899 Other long term (current) drug therapy: Secondary | ICD-10-CM

## 2022-09-08 DIAGNOSIS — Z131 Encounter for screening for diabetes mellitus: Secondary | ICD-10-CM

## 2022-09-08 DIAGNOSIS — Z13220 Encounter for screening for lipoid disorders: Secondary | ICD-10-CM

## 2022-09-08 DIAGNOSIS — Z0000 Encounter for general adult medical examination without abnormal findings: Secondary | ICD-10-CM | POA: Diagnosis not present

## 2022-09-08 LAB — LIPID PANEL
Cholesterol: 173 mg/dL (ref 0–200)
HDL: 85.7 mg/dL (ref >39.00)
LDL Cholesterol: 71 mg/dL (ref 0–99)
NonHDL: 87.07
Total CHOL/HDL Ratio: 2
Triglycerides: 80 mg/dL (ref 0.0–149.0)
VLDL: 16 mg/dL (ref 0.0–40.0)

## 2022-09-08 LAB — CBC WITH DIFFERENTIAL/PLATELET
Basophils Absolute: 0.1 10*3/uL (ref 0.0–0.1)
Basophils Relative: 0.8 % (ref 0.0–3.0)
Eosinophils Absolute: 0.2 10*3/uL (ref 0.0–0.7)
Eosinophils Relative: 2.9 % (ref 0.0–5.0)
HCT: 42.8 % (ref 36.0–46.0)
Hemoglobin: 14.8 g/dL (ref 12.0–15.0)
Lymphocytes Relative: 17.6 % (ref 12.0–46.0)
Lymphs Abs: 1.2 10*3/uL (ref 0.7–4.0)
MCHC: 34.6 g/dL (ref 30.0–36.0)
MCV: 92.7 fl (ref 78.0–100.0)
Monocytes Absolute: 0.4 10*3/uL (ref 0.1–1.0)
Monocytes Relative: 5.7 % (ref 3.0–12.0)
Neutro Abs: 5.1 10*3/uL (ref 1.4–7.7)
Neutrophils Relative %: 73 % (ref 43.0–77.0)
Platelets: 163 10*3/uL (ref 150.0–400.0)
RBC: 4.62 Mil/uL (ref 3.87–5.11)
RDW: 13.2 % (ref 11.5–15.5)
WBC: 7 10*3/uL (ref 4.0–10.5)

## 2022-09-08 LAB — BASIC METABOLIC PANEL
BUN: 17 mg/dL (ref 6–23)
CO2: 29 mEq/L (ref 19–32)
Calcium: 10.1 mg/dL (ref 8.4–10.5)
Chloride: 100 mEq/L (ref 96–112)
Creatinine, Ser: 0.71 mg/dL (ref 0.40–1.20)
GFR: 91.98 mL/min (ref >60.00)
Glucose, Bld: 98 mg/dL (ref 70–99)
Potassium: 5 mEq/L (ref 3.5–5.1)
Sodium: 138 mEq/L (ref 135–145)

## 2022-09-08 LAB — HEPATIC FUNCTION PANEL
ALT: 17 U/L (ref 0–35)
AST: 20 U/L (ref 0–37)
Albumin: 4.8 g/dL (ref 3.5–5.2)
Alkaline Phosphatase: 65 U/L (ref 39–117)
Bilirubin, Direct: 0.3 mg/dL (ref 0.0–0.3)
Total Bilirubin: 1.5 mg/dL — ABNORMAL HIGH (ref 0.2–1.2)
Total Protein: 7.1 g/dL (ref 6.0–8.3)

## 2022-09-08 LAB — HEMOGLOBIN A1C: Hgb A1c MFr Bld: 5.5 % (ref 4.6–6.5)

## 2022-09-08 LAB — TSH: TSH: 1.67 u[IU]/mL (ref 0.35–5.50)

## 2022-09-08 MED ORDER — ALPRAZOLAM 0.5 MG PO TABS: ORAL_TABLET | ORAL | 1 refills | Status: DC

## 2022-10-18 ENCOUNTER — Other Ambulatory Visit: Payer: Self-pay | Admitting: Family Medicine

## 2023-01-06 ENCOUNTER — Other Ambulatory Visit: Payer: Self-pay | Admitting: Family Medicine

## 2023-01-07 NOTE — Telephone Encounter (Signed)
Last office visit 09/08/22 for CPE.  Last refilled 09/08/22 for #60 with 1 refill.  Next Appt: No future appointments.

## 2023-01-31 IMAGING — DX DG FINGER INDEX 2+V*L*
3 series · 3 of 3 positions shown · non-contrast
Comparison: None.

CLINICAL DATA: Redness and swelling for 1 week

EXAM:
LEFT INDEX FINGER 2+V

[2. finger lat]
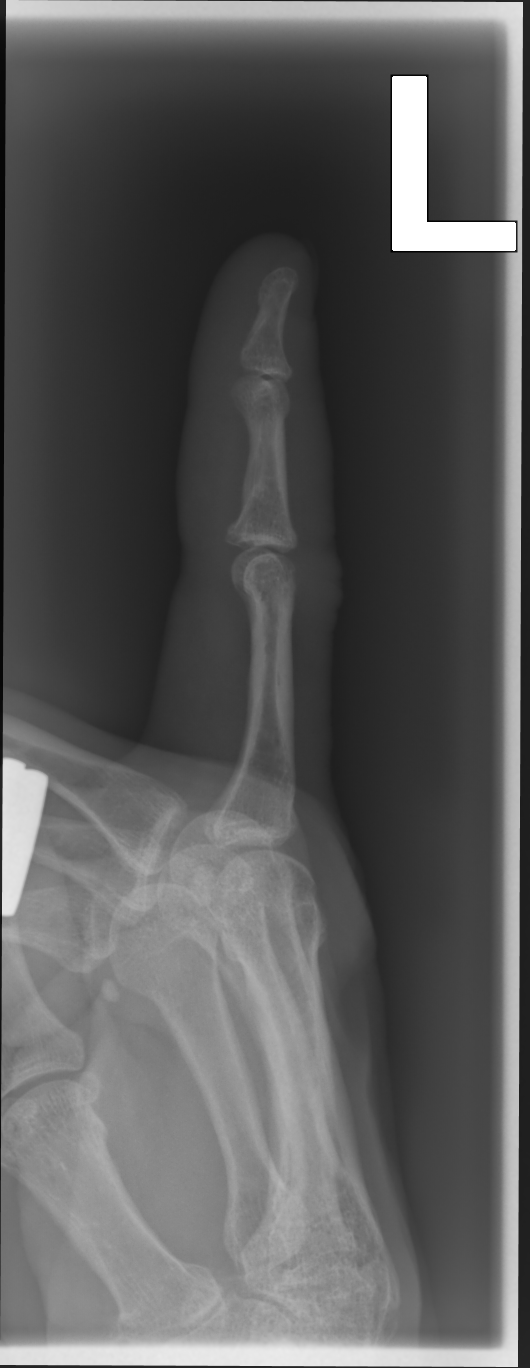

[finger pa]
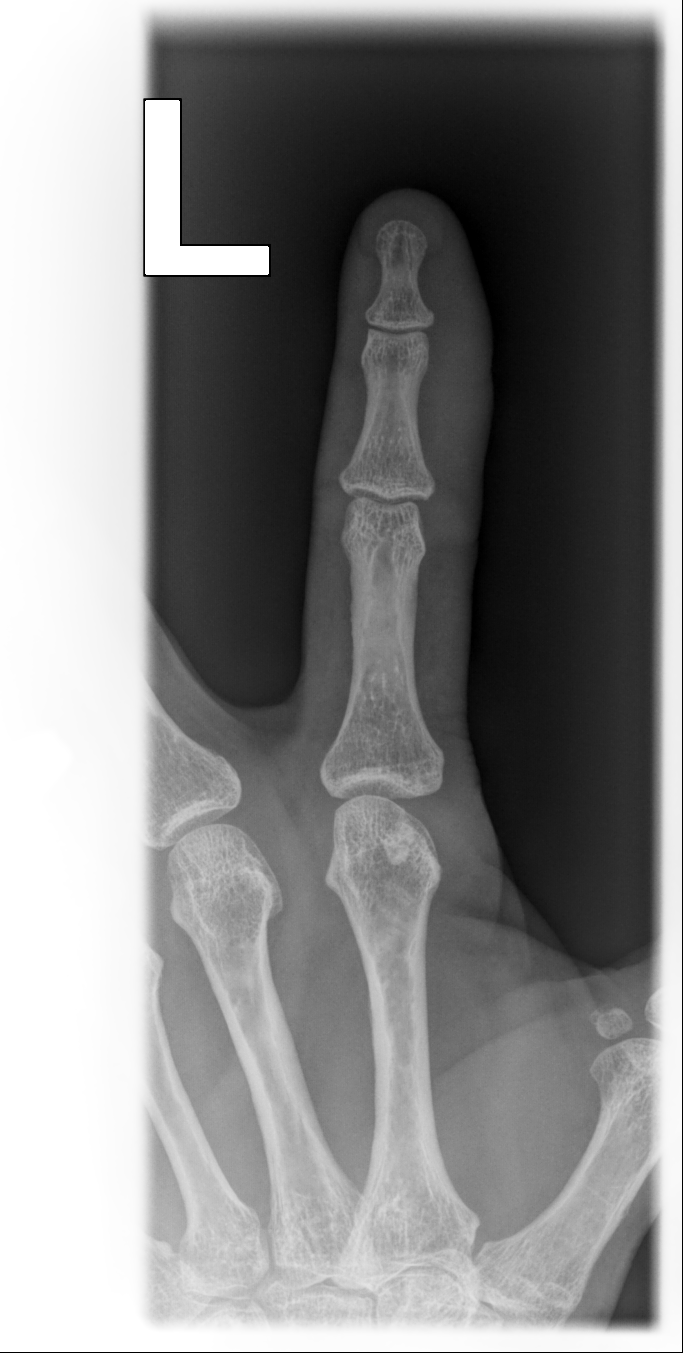

[finger mlo]
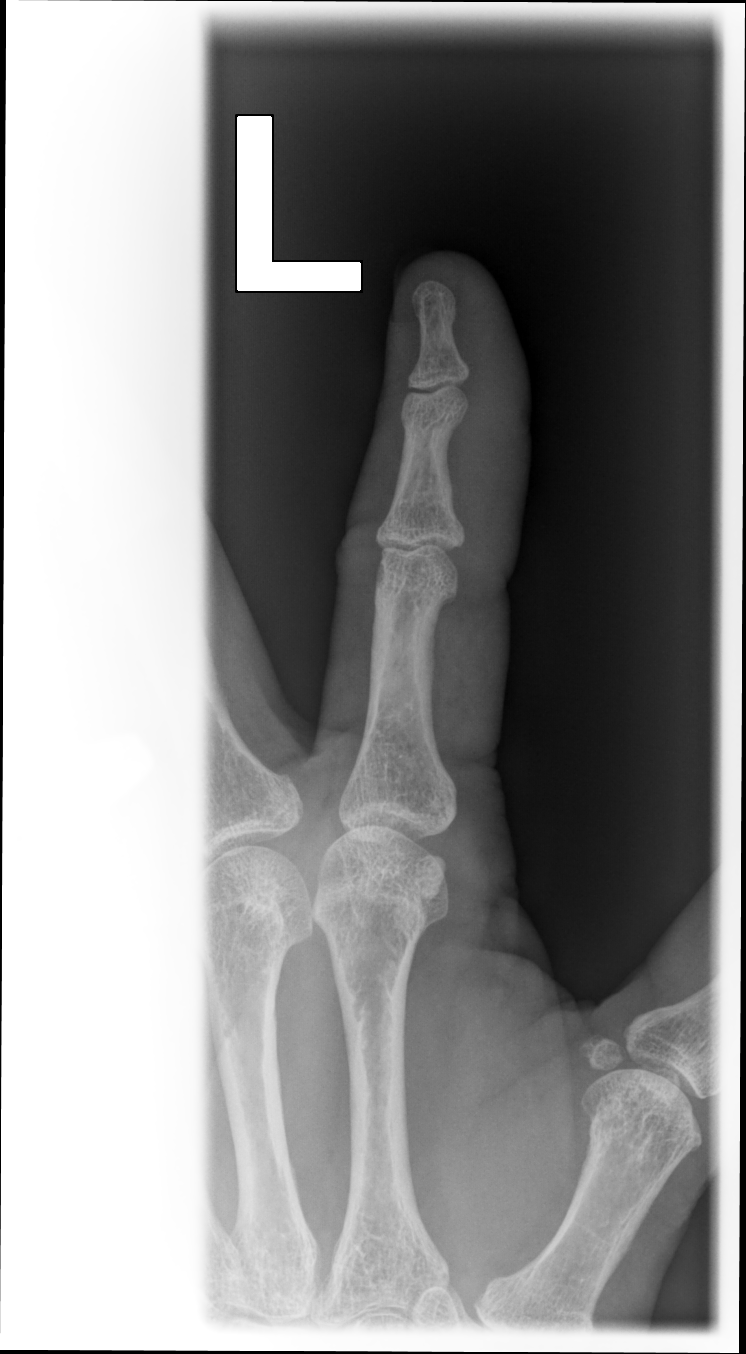

[3 of 3 positions shown; findings below may reference images not displayed]

FINDINGS: Mild soft tissue swelling is noted. No acute bony abnormality is
seen. No radiopaque foreign body is noted.
IMPRESSION: Soft tissue swelling without acute bony abnormality.

## 2023-02-25 ENCOUNTER — Ambulatory Visit: Payer: BC Managed Care – PPO | Admitting: Dermatology

## 2023-03-09 ENCOUNTER — Other Ambulatory Visit: Payer: Self-pay | Admitting: Family Medicine

## 2023-05-05 ENCOUNTER — Other Ambulatory Visit: Payer: Self-pay | Admitting: Family Medicine

## 2023-05-06 NOTE — Telephone Encounter (Signed)
Last office visit 09/08/2022 for CPE.  Last refilled 01/07/2023 for #60 with 1 refill.  Next Appt: No future appointments.

## 2023-07-09 ENCOUNTER — Other Ambulatory Visit: Payer: Self-pay | Admitting: Family Medicine

## 2023-07-09 DIAGNOSIS — Z1231 Encounter for screening mammogram for malignant neoplasm of breast: Secondary | ICD-10-CM

## 2023-08-07 DIAGNOSIS — W5503XA Scratched by cat, initial encounter: Secondary | ICD-10-CM | POA: Diagnosis not present

## 2023-08-07 DIAGNOSIS — S60811A Abrasion of right wrist, initial encounter: Secondary | ICD-10-CM | POA: Diagnosis not present

## 2023-08-07 DIAGNOSIS — L089 Local infection of the skin and subcutaneous tissue, unspecified: Secondary | ICD-10-CM | POA: Diagnosis not present

## 2023-08-10 ENCOUNTER — Other Ambulatory Visit: Payer: Self-pay

## 2023-08-10 ENCOUNTER — Emergency Department: Payer: BC Managed Care – PPO

## 2023-08-10 ENCOUNTER — Encounter: Payer: Self-pay | Admitting: Emergency Medicine

## 2023-08-10 ENCOUNTER — Inpatient Hospital Stay: Payer: BC Managed Care – PPO | Admitting: Anesthesiology

## 2023-08-10 ENCOUNTER — Encounter
Admission: EM | Disposition: A | Discharge status: Home / Self Care | Payer: Self-pay | Source: Ambulatory Visit | Attending: Internal Medicine

## 2023-08-10 ENCOUNTER — Inpatient Hospital Stay
Admission: EM | Admit: 2023-08-10 | Discharge: 2023-08-12 | DRG: 983 | Disposition: A | Discharge status: Home / Self Care | Payer: BC Managed Care – PPO | Source: Ambulatory Visit | Attending: Internal Medicine | Admitting: Internal Medicine

## 2023-08-10 DIAGNOSIS — Z85828 Personal history of other malignant neoplasm of skin: Secondary | ICD-10-CM

## 2023-08-10 DIAGNOSIS — Z800 Family history of malignant neoplasm of digestive organs: Secondary | ICD-10-CM

## 2023-08-10 DIAGNOSIS — Z801 Family history of malignant neoplasm of trachea, bronchus and lung: Secondary | ICD-10-CM

## 2023-08-10 DIAGNOSIS — Z716 Tobacco abuse counseling: Secondary | ICD-10-CM | POA: Diagnosis not present

## 2023-08-10 DIAGNOSIS — A4901 Methicillin susceptible Staphylococcus aureus infection, unspecified site: Secondary | ICD-10-CM | POA: Diagnosis present

## 2023-08-10 DIAGNOSIS — Z886 Allergy status to analgesic agent status: Secondary | ICD-10-CM

## 2023-08-10 DIAGNOSIS — Z8249 Family history of ischemic heart disease and other diseases of the circulatory system: Secondary | ICD-10-CM

## 2023-08-10 DIAGNOSIS — F17210 Nicotine dependence, cigarettes, uncomplicated: Secondary | ICD-10-CM | POA: Diagnosis not present

## 2023-08-10 DIAGNOSIS — Z8711 Personal history of peptic ulcer disease: Secondary | ICD-10-CM

## 2023-08-10 DIAGNOSIS — Z803 Family history of malignant neoplasm of breast: Secondary | ICD-10-CM

## 2023-08-10 DIAGNOSIS — L02413 Cutaneous abscess of right upper limb: Secondary | ICD-10-CM | POA: Diagnosis present

## 2023-08-10 DIAGNOSIS — L03113 Cellulitis of right upper limb: Principal | ICD-10-CM | POA: Diagnosis present

## 2023-08-10 DIAGNOSIS — W5501XA Bitten by cat, initial encounter: Secondary | ICD-10-CM | POA: Diagnosis not present

## 2023-08-10 DIAGNOSIS — Z888 Allergy status to other drugs, medicaments and biological substances status: Secondary | ICD-10-CM | POA: Diagnosis not present

## 2023-08-10 DIAGNOSIS — J449 Chronic obstructive pulmonary disease, unspecified: Secondary | ICD-10-CM | POA: Diagnosis present

## 2023-08-10 DIAGNOSIS — S61551A Open bite of right wrist, initial encounter: Secondary | ICD-10-CM | POA: Diagnosis present

## 2023-08-10 DIAGNOSIS — K219 Gastro-esophageal reflux disease without esophagitis: Secondary | ICD-10-CM | POA: Diagnosis present

## 2023-08-10 DIAGNOSIS — Z8041 Family history of malignant neoplasm of ovary: Secondary | ICD-10-CM

## 2023-08-10 DIAGNOSIS — F411 Generalized anxiety disorder: Secondary | ICD-10-CM | POA: Diagnosis present

## 2023-08-10 DIAGNOSIS — Z23 Encounter for immunization: Secondary | ICD-10-CM | POA: Diagnosis not present

## 2023-08-10 DIAGNOSIS — Z79899 Other long term (current) drug therapy: Secondary | ICD-10-CM

## 2023-08-10 DIAGNOSIS — M7989 Other specified soft tissue disorders: Secondary | ICD-10-CM | POA: Diagnosis not present

## 2023-08-10 DIAGNOSIS — S61451A Open bite of right hand, initial encounter: Secondary | ICD-10-CM | POA: Diagnosis not present

## 2023-08-10 DIAGNOSIS — M71031 Abscess of bursa, right wrist: Secondary | ICD-10-CM | POA: Diagnosis not present

## 2023-08-10 DIAGNOSIS — M1811 Unilateral primary osteoarthritis of first carpometacarpal joint, right hand: Secondary | ICD-10-CM | POA: Diagnosis not present

## 2023-08-10 DIAGNOSIS — Z833 Family history of diabetes mellitus: Secondary | ICD-10-CM

## 2023-08-10 DIAGNOSIS — Z880 Allergy status to penicillin: Secondary | ICD-10-CM

## 2023-08-10 DIAGNOSIS — Z823 Family history of stroke: Secondary | ICD-10-CM

## 2023-08-10 DIAGNOSIS — M65841 Other synovitis and tenosynovitis, right hand: Secondary | ICD-10-CM | POA: Diagnosis not present

## 2023-08-10 HISTORY — PX: INCISION AND DRAINAGE ABSCESS: SHX5864

## 2023-08-10 LAB — BASIC METABOLIC PANEL
Anion gap: 11 (ref 5–15)
BUN: 13 mg/dL (ref 8–23)
CO2: 25 mmol/L (ref 22–32)
Calcium: 9.2 mg/dL (ref 8.9–10.3)
Chloride: 101 mmol/L (ref 98–111)
Creatinine, Ser: 0.58 mg/dL (ref 0.44–1.00)
GFR, Estimated: >60 mL/min (ref >60)
Glucose, Bld: 85 mg/dL (ref 70–99)
Potassium: 3.4 mmol/L — ABNORMAL LOW (ref 3.5–5.1)
Sodium: 137 mmol/L (ref 135–145)

## 2023-08-10 LAB — CBC WITH DIFFERENTIAL/PLATELET
Abs Immature Granulocytes: 0.03 10*3/uL (ref 0.00–0.07)
Basophils Absolute: 0 10*3/uL (ref 0.0–0.1)
Basophils Relative: 0 %
Eosinophils Absolute: 0.2 10*3/uL (ref 0.0–0.5)
Eosinophils Relative: 2 %
HCT: 42.4 % (ref 36.0–46.0)
Hemoglobin: 14.3 g/dL (ref 12.0–15.0)
Immature Granulocytes: 0 %
Lymphocytes Relative: 16 %
Lymphs Abs: 1.2 10*3/uL (ref 0.7–4.0)
MCH: 30.6 pg (ref 26.0–34.0)
MCHC: 33.7 g/dL (ref 30.0–36.0)
MCV: 90.6 fL (ref 80.0–100.0)
Monocytes Absolute: 0.7 10*3/uL (ref 0.1–1.0)
Monocytes Relative: 10 %
Neutro Abs: 5.3 10*3/uL (ref 1.7–7.7)
Neutrophils Relative %: 72 %
Platelets: 191 10*3/uL (ref 150–400)
RBC: 4.68 MIL/uL (ref 3.87–5.11)
RDW: 12.3 % (ref 11.5–15.5)
WBC: 7.4 10*3/uL (ref 4.0–10.5)
nRBC: 0 % (ref 0.0–0.2)

## 2023-08-10 LAB — LACTIC ACID, PLASMA: Lactic Acid, Venous: 0.9 mmol/L (ref 0.5–1.9)

## 2023-08-10 SURGERY — INCISION AND DRAINAGE, ABSCESS
Anesthesia: General | Laterality: Right

## 2023-08-10 MED ORDER — LIDOCAINE HCL (PF) 2 % IJ SOLN
INTRAMUSCULAR | Status: AC
  Filled 2023-08-10: qty 5

## 2023-08-10 MED ORDER — FENTANYL CITRATE (PF) 100 MCG/2ML IJ SOLN
INTRAMUSCULAR | Status: AC
  Filled 2023-08-10: qty 2

## 2023-08-10 MED ORDER — METRONIDAZOLE 500 MG/100ML IV SOLN
500.0000 mg | Freq: Two times a day (BID) | INTRAVENOUS | Status: DC
  Administered 2023-08-10 – 2023-08-12 (×4): 500 mg via INTRAVENOUS
  Filled 2023-08-10 (×4): qty 100

## 2023-08-10 MED ORDER — FENTANYL CITRATE (PF) 100 MCG/2ML IJ SOLN
INTRAMUSCULAR | Status: DC | PRN
  Administered 2023-08-10 (×4): 25 ug via INTRAVENOUS

## 2023-08-10 MED ORDER — SODIUM CHLORIDE 0.9 % IV SOLN: INTRAVENOUS | Status: DC

## 2023-08-10 MED ORDER — OXYCODONE HCL 5 MG PO TABS
5.0000 mg | ORAL_TABLET | Freq: Once | ORAL | Status: AC | PRN
  Administered 2023-08-10: 5 mg via ORAL

## 2023-08-10 MED ORDER — SODIUM CHLORIDE 0.9 % IV SOLN
Status: DC | PRN
  Administered 2023-08-10: 3000 mL

## 2023-08-10 MED ORDER — CEFAZOLIN SODIUM-DEXTROSE 2-3 GM-%(50ML) IV SOLR
INTRAVENOUS | Status: DC | PRN
  Administered 2023-08-10: 2 g via INTRAVENOUS

## 2023-08-10 MED ORDER — FENTANYL CITRATE (PF) 100 MCG/2ML IJ SOLN
25.0000 ug | INTRAMUSCULAR | Status: DC | PRN
  Administered 2023-08-10: 25 ug via INTRAVENOUS
  Administered 2023-08-10: 50 ug via INTRAVENOUS
  Administered 2023-08-10: 25 ug via INTRAVENOUS

## 2023-08-10 MED ORDER — LEVOFLOXACIN IN D5W 500 MG/100ML IV SOLN
500.0000 mg | INTRAVENOUS | Status: DC
  Administered 2023-08-10 – 2023-08-11 (×2): 500 mg via INTRAVENOUS
  Filled 2023-08-10 (×2): qty 100

## 2023-08-10 MED ORDER — ONDANSETRON HCL 4 MG/2ML IJ SOLN
INTRAMUSCULAR | Status: DC | PRN
Start: 2023-08-10 — End: 2023-08-10
  Administered 2023-08-10: 4 mg via INTRAVENOUS

## 2023-08-10 MED ORDER — DEXAMETHASONE SODIUM PHOSPHATE 10 MG/ML IJ SOLN
INTRAMUSCULAR | Status: DC | PRN
Start: 2023-08-10 — End: 2023-08-10
  Administered 2023-08-10: 10 mg via INTRAVENOUS

## 2023-08-10 MED ORDER — TETANUS-DIPHTH-ACELL PERTUSSIS 5-2.5-18.5 LF-MCG/0.5 IM SUSY
0.5000 mL | PREFILLED_SYRINGE | Freq: Once | INTRAMUSCULAR | Status: AC
  Administered 2023-08-10: 0.5 mL via INTRAMUSCULAR
  Filled 2023-08-10: qty 0.5

## 2023-08-10 MED ORDER — CLINDAMYCIN PHOSPHATE 600 MG/50ML IV SOLN
600.0000 mg | Freq: Three times a day (TID) | INTRAVENOUS | Status: DC
  Administered 2023-08-10: 600 mg via INTRAVENOUS
  Filled 2023-08-10: qty 50

## 2023-08-10 MED ORDER — NICOTINE 21 MG/24HR TD PT24: 21.0000 mg | MEDICATED_PATCH | Freq: Every day | TRANSDERMAL | Status: DC

## 2023-08-10 MED ORDER — MORPHINE SULFATE (PF) 4 MG/ML IV SOLN
4.0000 mg | INTRAVENOUS | Status: DC | PRN
  Administered 2023-08-10: 4 mg via INTRAVENOUS
  Filled 2023-08-10: qty 1

## 2023-08-10 MED ORDER — PROPOFOL 500 MG/50ML IV EMUL
INTRAVENOUS | Status: DC | PRN
Start: 2023-08-10 — End: 2023-08-10
  Administered 2023-08-10: 40 ug via INTRAVENOUS

## 2023-08-10 MED ORDER — PROPOFOL 10 MG/ML IV BOLUS
INTRAVENOUS | Status: DC | PRN
  Administered 2023-08-10: 100 ug/kg/min via INTRAVENOUS

## 2023-08-10 MED ORDER — ACETAMINOPHEN 10 MG/ML IV SOLN: 1000.0000 mg | Freq: Once | INTRAVENOUS | Status: DC | PRN

## 2023-08-10 MED ORDER — 0.9 % SODIUM CHLORIDE (POUR BTL) OPTIME
TOPICAL | Status: DC | PRN
  Administered 2023-08-10: 3000 mL

## 2023-08-10 MED ORDER — PROPOFOL 10 MG/ML IV BOLUS
INTRAVENOUS | Status: AC
  Filled 2023-08-10: qty 20

## 2023-08-10 MED ORDER — ONDANSETRON HCL 4 MG/2ML IJ SOLN: 4.0000 mg | Freq: Once | INTRAMUSCULAR | Status: DC | PRN

## 2023-08-10 MED ORDER — SULFAMETHOXAZOLE-TRIMETHOPRIM 400-80 MG/5ML IV SOLN
280.0000 mg | Freq: Two times a day (BID) | INTRAVENOUS | Status: DC
  Administered 2023-08-10: 280 mg via INTRAVENOUS
  Filled 2023-08-10 (×2): qty 17.5

## 2023-08-10 MED ORDER — MIDAZOLAM HCL 2 MG/2ML IJ SOLN
INTRAMUSCULAR | Status: AC
  Filled 2023-08-10: qty 2

## 2023-08-10 MED ORDER — OXYCODONE HCL 5 MG PO TABS
ORAL_TABLET | ORAL | Status: AC
  Filled 2023-08-10: qty 1

## 2023-08-10 MED ORDER — GADOBUTROL 1 MMOL/ML IV SOLN
5.0000 mL | Freq: Once | INTRAVENOUS | Status: AC | PRN
  Administered 2023-08-10: 5 mL via INTRAVENOUS

## 2023-08-10 MED ORDER — OXYCODONE HCL 5 MG/5ML PO SOLN: 5.0000 mg | Freq: Once | ORAL | Status: AC | PRN

## 2023-08-10 MED ORDER — ENOXAPARIN SODIUM 40 MG/0.4ML IJ SOSY
40.0000 mg | PREFILLED_SYRINGE | INTRAMUSCULAR | Status: DC
  Administered 2023-08-11: 40 mg via SUBCUTANEOUS
  Filled 2023-08-10: qty 0.4

## 2023-08-10 MED ORDER — OXYCODONE HCL 5 MG PO TABS
5.0000 mg | ORAL_TABLET | ORAL | Status: DC | PRN
  Administered 2023-08-10 – 2023-08-11 (×2): 5 mg via ORAL
  Filled 2023-08-10 (×2): qty 1

## 2023-08-10 SURGICAL SUPPLY — 42 items
APL PRP STRL LF DISP 70% ISPRP (MISCELLANEOUS) ×1
BLADE SURG SZ10 CARB STEEL (BLADE) ×1 IMPLANT
BNDG CMPR 5X4 KNIT ELC UNQ LF (GAUZE/BANDAGES/DRESSINGS) ×1
BNDG CMPR STD VLCR NS LF 5.8X3 (GAUZE/BANDAGES/DRESSINGS) ×1
BNDG CMPR STD VLCR NS LF 5.8X6 (GAUZE/BANDAGES/DRESSINGS) ×1
BNDG ELASTIC 3X5.8 VLCR NS LF (GAUZE/BANDAGES/DRESSINGS) IMPLANT
BNDG ELASTIC 4INX 5YD STR LF (GAUZE/BANDAGES/DRESSINGS) ×1 IMPLANT
BNDG ELASTIC 6X5.8 VLCR NS LF (GAUZE/BANDAGES/DRESSINGS) IMPLANT
BNDG ESMARCH 4 X 12 STRL LF (GAUZE/BANDAGES/DRESSINGS) ×1
BNDG ESMARCH 4X12 STRL LF (GAUZE/BANDAGES/DRESSINGS) ×1 IMPLANT
CHLORAPREP W/TINT 26 (MISCELLANEOUS) ×1 IMPLANT
CUFF TOURN SGL QUICK 18X4 (TOURNIQUET CUFF) IMPLANT
CUFF TOURN SGL QUICK 24 (TOURNIQUET CUFF)
CUFF TRNQT CYL 24X4X16.5-23 (TOURNIQUET CUFF) IMPLANT
ELECT REM PT RETURN 9FT ADLT (ELECTROSURGICAL) ×1
ELECTRODE REM PT RTRN 9FT ADLT (ELECTROSURGICAL) ×1 IMPLANT
GAUZE SPONGE 4X4 12PLY STRL (GAUZE/BANDAGES/DRESSINGS) IMPLANT
GLOVE BIO SURGEON STRL SZ8 (GLOVE) ×1 IMPLANT
GLOVE INDICATOR 8.0 STRL GRN (GLOVE) ×1 IMPLANT
GLOVE SURG ORTHO 8.5 STRL (GLOVE) ×1 IMPLANT
GOWN STRL REUS W/ TWL LRG LVL3 (GOWN DISPOSABLE) ×1 IMPLANT
GOWN STRL REUS W/ TWL XL LVL3 (GOWN DISPOSABLE) ×1 IMPLANT
GOWN STRL REUS W/TWL LRG LVL3 (GOWN DISPOSABLE) ×1
GOWN STRL REUS W/TWL XL LVL3 (GOWN DISPOSABLE) ×1
KIT TURNOVER KIT A (KITS) ×1 IMPLANT
LABEL OR SOLS (LABEL) ×1 IMPLANT
MANIFOLD NEPTUNE II (INSTRUMENTS) ×1 IMPLANT
NDL SAFETY ECLIP 18X1.5 (MISCELLANEOUS) ×1 IMPLANT
NS IRRIG 1000ML POUR BTL (IV SOLUTION) ×1 IMPLANT
PACK EXTREMITY ARMC (MISCELLANEOUS) ×1 IMPLANT
PACKING GAUZE IODOFORM 1INX5YD (GAUZE/BANDAGES/DRESSINGS) IMPLANT
PAD CAST 4YDX4 CTTN HI CHSV (CAST SUPPLIES) ×1 IMPLANT
PADDING CAST BLEND 4X4 STRL (MISCELLANEOUS) ×1 IMPLANT
PADDING CAST COTTON 4X4 STRL (CAST SUPPLIES) ×1
SPLINT CAST 1 STEP 3X12 (MISCELLANEOUS) ×1 IMPLANT
SPONGE T-LAP 18X18 ~~LOC~~+RFID (SPONGE) ×1 IMPLANT
STAPLER SKIN PROX 35W (STAPLE) ×1 IMPLANT
STOCKINETTE IMPERVIOUS 9X36 MD (GAUZE/BANDAGES/DRESSINGS) ×1 IMPLANT
SUT PROLENE 4 0 PS 2 18 (SUTURE) ×2 IMPLANT
SYR 10ML LL (SYRINGE) ×1 IMPLANT
TRAP FLUID SMOKE EVACUATOR (MISCELLANEOUS) ×1 IMPLANT
WATER STERILE IRR 500ML POUR (IV SOLUTION) ×1 IMPLANT

## 2023-08-10 NOTE — Brief Op Note (Signed)
08/10/2023  7:56 PM  PATIENT:  Nichole Hogan  62 y.o. female  PRE-OPERATIVE DIAGNOSIS:  RIGHT WRIST ABSCESS  POST-OPERATIVE DIAGNOSIS:  RIGHT WRIST ABSCESS  PROCEDURE:  Procedure(s): INCISION AND DRAINAGE RIGHT WRIST ABSCESS (Right) Packing with iodoform packing  SURGEON:  Surgeons and Role:    * Magdiel Bartles, Adelfa Koh, MD - Primary  PHYSICIAN ASSISTANT:   ASSISTANTS: none   ANESTHESIA:   MAC  EBL: Minimal  BLOOD ADMINISTERED:none  DRAINS: none   LOCAL MEDICATIONS USED:  NONE  SPECIMEN:  Source of Specimen:  Right wrist, 2 samples sent for micro, Gram stain and culture analysis  DISPOSITION OF SPECIMEN:   Micro  COUNTS:  YES  TOURNIQUET:  * Missing tourniquet times found for documented tourniquets in log: [id number] *  DICTATION: .Reubin [place] Dictation  PLAN OF CARE: Admit to inpatient   PATIENT DISPOSITION:  PACU - hemodynamically stable.   Delay start of Pharmacological VTE agent (>24hrs) due to surgical blood loss or risk of bleeding: no
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
## 2023-08-10 NOTE — ED Triage Notes (Signed)
Patient arrives ambulatory by POV sent by [facility] for possible infected joint to right hand. Patient on antibiotic for cat bite and is now having trouble moving wrist join and hand. Hand swollen in triage. Cat bite happened last Wednesday and was started on doxy.
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
## 2023-08-10 NOTE — ED Provider Notes (Signed)
[facility] Provider Note    Event Date/Time   First MD Initiated Contact with Patient 08/10/23 1326     (approximate)   History   Animal Bite (Swollen Hand)   HPI  KOVA MUNSCH is a 62 y.o. female   Past medical history of no significant past medical history presents emergency department with pain swelling redness increasing despite p.o. antibiotics for the last 3 days in the setting of cat bite.  He was prescribed doxycycline on Friday and has been fully compliant, has penicillin allergy.  Denies systemic symptoms of infection like fevers.  She does have intermittent chills.  Independent Historian contributed to assessment above: Has been is at bedside to corroborate information given above    Physical Exam   Triage Vital Signs: ED Triage Vitals [08/10/23 1318]  Encounter Vitals Group     BP (!) 157/83     Systolic BP Percentile      Diastolic BP Percentile      Pulse Rate 79     Resp 16     Temp 98.4 F (36.9 C)     Temp Source Oral     SpO2 100 %     Weight 126 lb (57.2 kg)     Height 5\' 3"  (1.6 m)     Head Circumference      Peak Flow      Pain Score 2     Pain Loc      Pain Education      Exclude from Growth Chart     Most recent vital signs: Vitals:   08/10/23 1318  BP: (!) 157/83  Pulse: 79  Resp: 16  Temp: 98.4 F (36.9 C)  SpO2: 100%    General: Awake, no distress.  CV:  Good peripheral perfusion.  Resp:  Normal effort.  Abd:  No distention.  Other:  Cellulitic changes red warmth to the dorsal side of the right wrist spreading to the dorsal side of the hand and up the right forearm.  Neurovascular intact.  Ranging slightly at the wrist but difficult due to pain.  No obvious abscess.   ED Results / Procedures / Treatments   Labs (all labs ordered are listed, but only abnormal results are displayed) Labs Reviewed  BASIC METABOLIC PANEL - Abnormal; Notable for the following components:      Result Value    Potassium 3.4 (*)    All other components within normal limits  CULTURE, BLOOD (ROUTINE X 2)  CULTURE, BLOOD (ROUTINE X 2)  CBC WITH DIFFERENTIAL/PLATELET  LACTIC ACID, PLASMA  LACTIC ACID, PLASMA     I ordered and reviewed the above labs they are notable for white blood cell count is normal   PROCEDURES:  Critical Care performed: No  Procedures   MEDICATIONS ORDERED IN ED: Medications  sulfamethoxazole-trimethoprim (BACTRIM) 280 mg of trimethoprim in dextrose 5 % 500 mL IVPB (0 mg of trimethoprim Intravenous Hold 08/10/23 1456)  clindamycin (CLEOCIN) IVPB 600 mg (600 mg Intravenous New Bag/Given 08/10/23 1549)  gadobutrol (GADAVIST) 1 MMOL/ML injection 5 mL (5 mLs Intravenous Contrast Given 08/10/23 1525)    External physician / consultants:  I spoke with hospitalist for admission regarding care plan for this patient.   IMPRESSION / MDM / ASSESSMENT AND PLAN / ED COURSE  I reviewed the triage vital signs and the nursing notes.  Patient's presentation is most consistent with acute presentation with potential threat to life or bodily function.  Differential diagnosis includes, but is not limited to, cellulitis, abscess, sepsis, joint space infection   The patient is on the cardiac monitor to evaluate for evidence of arrhythmia and/or significant heart rate changes.  MDM: Failed p.o. outpatient antibiotics after cat bite, worsening cellulitic changes with no obvious abscess.  Treat with IV antibiotics for cellulitis/mammalian bite coverage.  Get basic labs.  Get blood cultures lactic.  Admission to hospitalist service.  Some clinical concern given puncture wound to wrist for joint space infection, obtain MRI.  Orthopedics consultation.       FINAL CLINICAL IMPRESSION(S) / ED DIAGNOSES   Final diagnoses:  Cat bite, initial encounter  Cellulitis of right hand     Rx / DC Orders   ED Discharge Orders     None        Note:  This  document was prepared using Dragon voice recognition software and may include unintentional dictation errors.    Pilar Jarvis, MD 08/10/23 [phone]

## 2023-08-10 NOTE — Assessment & Plan Note (Signed)
Stable from respiratory standpoint Continue home inhalers 

## 2023-08-10 NOTE — Anesthesia Postprocedure Evaluation (Signed)
Anesthesia Post Note  Patient: Nichole Hogan  Procedure(s) Performed: INCISION AND DRAINAGE RIGHT WRIST ABSCESS (Right)  Patient location during evaluation: PACU Anesthesia Type: General Level of consciousness: awake and alert Pain management: pain level controlled Vital Signs Assessment: post-procedure vital signs reviewed and stable Respiratory status: spontaneous breathing, nonlabored ventilation, respiratory function stable and patient connected to nasal cannula oxygen Cardiovascular status: blood pressure returned to baseline and stable Postop Assessment: no apparent nausea or vomiting Anesthetic complications: no   No notable events documented.   Last Vitals:  Vitals:   08/10/23 2015 08/10/23 2042  BP: 110/87 117/68  Pulse: 68 66  Resp: 18 18  Temp: 36.8 C 36.8 C  SpO2: 95% 94%    Last Pain:  Vitals:   08/10/23 2015  TempSrc:   PainSc: 4                  Corinda Gubler

## 2023-08-10 NOTE — Transfer of Care (Signed)
Immediate Anesthesia Transfer of Care Note  Patient: Nichole Hogan  Procedure(s) Performed: INCISION AND DRAINAGE RIGHT WRIST ABSCESS (Right)  Patient Location: PACU  Anesthesia Type:General  Level of Consciousness: awake, alert , and oriented  Airway & Oxygen Therapy: Patient Spontanous Breathing  Post-op Assessment: Report given to RN and Post -op Vital signs reviewed and stable  Post vital signs: Reviewed and stable  Last Vitals:  Vitals Value Taken Time  BP 142/85   Temp    Pulse 68 08/10/23 1955  Resp 15 08/10/23 1955  SpO2 91 % 08/10/23 1955  Vitals shown include unfiled device data.  Last Pain:  Vitals:   08/10/23 1842  TempSrc: Temporal  PainSc: 3          Complications: No notable events documented.

## 2023-08-10 NOTE — Assessment & Plan Note (Signed)
Worsening right forearm redness, joint swelling concerning for secondary joint space infection MRI of the wrist pending IV Levaquin and Flagyl for infectious coverage in the setting of penicillin allergy Dr. Clemencia Course with orthopedic surgery consulted Follow-up recommendations Monitor
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
## 2023-08-10 NOTE — Plan of Care (Signed)

## 2023-08-10 NOTE — Assessment & Plan Note (Signed)
PPI ?

## 2023-08-10 NOTE — Op Note (Signed)
08/10/2023  7:56 PM  PATIENT:  Nichole Hogan  62 y.o. female  PRE-OPERATIVE DIAGNOSIS:  RIGHT WRIST ABSCESS  POST-OPERATIVE DIAGNOSIS:  RIGHT WRIST ABSCESS  PROCEDURE:  Procedure(s): INCISION AND DRAINAGE RIGHT WRIST ABSCESS (Right) Packing with iodoform packing  SURGEON:  Surgeons and Role:    * Tysheena Ginzburg, Adelfa Koh, MD - Primary  PHYSICIAN ASSISTANT:   ASSISTANTS: none   ANESTHESIA:   MAC  EBL: Minimal  BLOOD ADMINISTERED:none  DRAINS: none   LOCAL MEDICATIONS USED:  NONE  SPECIMEN:  Source of Specimen:  Right wrist, 2 samples sent for micro, Gram stain and culture analysis  DISPOSITION OF SPECIMEN:  Micro  COUNTS:  YES  TOURNIQUET:  * Missing tourniquet times found for documented tourniquets in log: [id number] *  DICTATION: .Reubin Milan Dictation  PLAN OF CARE: Admit to inpatient   PATIENT DISPOSITION:  PACU - hemodynamically stable.   Delay start of Pharmacological VTE agent (>24hrs) due to surgical blood loss or risk of bleeding: no  Description of procedure:  The patient had a cat bite by the family cat about 10 days ago and failed p.o. antibiotics and presented to the emergency department today with painful swollen red wrist.  She was found to have an abscess on MRI and clinical exam confirmed that.  She was indicated for surgical incision and drainage with irrigation and debridement.  In the emergency department I met with the patient and her husband explained everything to them including my recommendation for washout of the infection.  Their questions were answered.  We considered pros and cons risks and benefits of surgery versus nonsurgical measures and again surgery was recommended, especially because she failed p.o. antibiotic management.  Their questions were answered they desired to proceed to surgery.  At the surgical time we met again in the preoperative area and I discussed with the patient and answered questions.  Consent was obtained and verified.  She  was checked in by the surgical team and that she was taken back into surgery.  In surgery she was positioned supine the Hana table was brought in and the right upper extremity was prepped and draped in the standard sterile fashion.  Ancef was given. A timeout was done.  The case then began  We placed a tourniquet but did not inflate it during the case.  Incision was made over the dorsal central wrist in the area of the fluctuance in the area corresponding to the MRI findings of abscess.  This was over the fourth dorsal wrist compartment.  The skin was incised and immediate purulent material was drained out.  This was more chronic congealed purulent material consistent with her 10-day history of infection.  Samples were sent for microbiology analysis.  The incision was about 4 cm.  A milking maneuver was used for removal of the obvious material and then a blunt Tresa Endo was placed in the subcutaneous layer and was used to open up the soft tissue envelope.  At this point we began to visualize the extensor tendons.  There was some infectious tissue that was sharply debrided.  Debridement was from the level of the skin through the subcutaneous tissue down to the tendon layer.  We identified and protected the extensor tendons.  There was no sign of any tendon insufficiency or tendon structural injury.  There was no sign of infection deep to the tendon floor.  The MRI also showed abscess in the soft tissue layer at the level of the tendons.  At this point  we had removed all obvious infection we had debrided the infectious tissue around the tendons and we had opened up the soft tissue envelope distal, proximal, radial, and ulnar so that we can begin our irrigation.  We used 3 L of antibiotic impregnated saline using the bulb syringe to flush out the zone of the infection.  We then used another 3 L of normal saline with the pulse lavage system.  At the end of the irrigation all the tissue was healthy appearing,  red and without any residual infection.  I then packed the wound with 1 inch iodoform packing.  No closure of the skin was done.  A bulky bandage was placed.  She set up an stockinette elevation.

## 2023-08-11 DIAGNOSIS — W5501XA Bitten by cat, initial encounter: Secondary | ICD-10-CM | POA: Diagnosis not present

## 2023-08-11 DIAGNOSIS — S61551A Open bite of right wrist, initial encounter: Secondary | ICD-10-CM | POA: Diagnosis not present

## 2023-08-11 LAB — COMPREHENSIVE METABOLIC PANEL
ALT: 14 U/L (ref 0–44)
AST: 14 U/L — ABNORMAL LOW (ref 15–41)
Albumin: 3.2 g/dL — ABNORMAL LOW (ref 3.5–5.0)
Alkaline Phosphatase: 51 U/L (ref 38–126)
Anion gap: 7 (ref 5–15)
BUN: 13 mg/dL (ref 8–23)
CO2: 24 mmol/L (ref 22–32)
Calcium: 8.5 mg/dL — ABNORMAL LOW (ref 8.9–10.3)
Chloride: 106 mmol/L (ref 98–111)
Creatinine, Ser: 0.59 mg/dL (ref 0.44–1.00)
GFR, Estimated: >60 mL/min (ref >60)
Glucose, Bld: 151 mg/dL — ABNORMAL HIGH (ref 70–99)
Potassium: 4.6 mmol/L (ref 3.5–5.1)
Sodium: 137 mmol/L (ref 135–145)
Total Bilirubin: 0.6 mg/dL (ref 0.3–1.2)
Total Protein: 5.9 g/dL — ABNORMAL LOW (ref 6.5–8.1)

## 2023-08-11 LAB — CBC
HCT: 35.5 % — ABNORMAL LOW (ref 36.0–46.0)
Hemoglobin: 12.4 g/dL (ref 12.0–15.0)
MCH: 30.8 pg (ref 26.0–34.0)
MCHC: 34.9 g/dL (ref 30.0–36.0)
MCV: 88.3 fL (ref 80.0–100.0)
Platelets: 168 10*3/uL (ref 150–400)
RBC: 4.02 MIL/uL (ref 3.87–5.11)
RDW: 12.2 % (ref 11.5–15.5)
WBC: 4.8 10*3/uL (ref 4.0–10.5)
nRBC: 0 % (ref 0.0–0.2)

## 2023-08-11 LAB — C-REACTIVE PROTEIN
CRP: 5.8 mg/dL — ABNORMAL HIGH (ref <1.0)
CRP: 4.7 mg/dL — ABNORMAL HIGH (ref <1.0)

## 2023-08-11 LAB — HIV ANTIBODY (ROUTINE TESTING W REFLEX): HIV Screen 4th Generation wRfx: NONREACTIVE

## 2023-08-11 MED ORDER — ARFORMOTEROL TARTRATE 15 MCG/2ML IN NEBU
15.0000 ug | INHALATION_SOLUTION | Freq: Two times a day (BID) | RESPIRATORY_TRACT | Status: DC
  Administered 2023-08-11: 15 ug via RESPIRATORY_TRACT
  Filled 2023-08-11 (×3): qty 2

## 2023-08-11 MED ORDER — ALPRAZOLAM 0.25 MG PO TABS: 0.2500 mg | ORAL_TABLET | Freq: Two times a day (BID) | ORAL | Status: DC | PRN

## 2023-08-11 MED ORDER — CITALOPRAM HYDROBROMIDE 10 MG PO TABS: 40.0000 mg | ORAL_TABLET | Freq: Every day | ORAL | Status: DC

## 2023-08-11 MED ORDER — POLYETHYLENE GLYCOL 3350 17 G PO PACK: 17.0000 g | PACK | Freq: Every day | ORAL | Status: DC | PRN

## 2023-08-11 NOTE — Progress Notes (Signed)
PROGRESS NOTE  Nichole Hogan OZH:[id number] DOB: 12/17/1960 DOA: 08/10/2023 PCP: Hannah Beat, MD  HPI/Recap of past 24 hours: Nichole Hogan is a 62 y.o. female with medical history significant of tobacco abuse, COPD, GERD, chronic anxiety presenting with cat bite of the right wrist roughly 1 week ago.  Associated with worsening swelling and redness.  Was seen by urgent care and started on course of doxycycline.  Pain progressed and worsened.  Baseline 2 pack/day smoker.  Does drink a fair amount of liquor daily though she has not drank in the last week.  Work up in the ED revealed R wrist abscess post cat bite.  IV Levaquin and IV Flagyl were initiated.  Seen by ortho.  Post I&D on 08/10/23.  08/11/23:  Seen and examined with her husband at her bedside.  No new complaints.  On IV abx and ortho following.  Assessment/Plan: Principal Problem:   Cat bite of right wrist Active Problems:   COPD (chronic obstructive pulmonary disease) (HCC)   GERD  Cat bite of right wrist R wrist abscess seen on MRI post I&D 08/10/23 Dr. Clemencia Course IV Levaquin and IV Flagyl for infectious coverage in the setting of penicillin allergy Dr. Clemencia Course with orthopedic surgery following Blood cx x2 negative to date.   GERD Continue PPI    COPD (chronic obstructive pulmonary disease) (HCC) C/w with PRN bronchodilators Not requiring O2 supplementation  Tobacco use disorder  Nicotine patch    Code Status: Full code   Family Communication: Husband at bedside   Disposition Plan: Will dc to home once ortho signs off    Consultants: Ortho   Procedures: I&D on 9/2   Antimicrobials: IV levaquin and IV flagyl   DVT prophylaxis:  SQ Lovenox daily  Status is: Inpatient     Objective: Vitals:   08/11/23 0037 08/11/23 0426 08/11/23 1000 08/11/23 1047  BP: 105/69 103/64 106/70 108/87  Pulse: 62 (!) 58 62 64  Resp: 17 18 20 18   Temp: 98 F (36.7 C) 98.2 F (36.8 C) 98.7 F (37.1 C) 98.5 F (36.9 C)   TempSrc:   Oral   SpO2: 92% 95% 94% 98%  Weight:      Height:        Intake/Output Summary (Last 24 hours) at 08/11/2023 1603 Last data filed at 08/11/2023 1545 Gross per 24 hour  Intake 1100 ml  Output 5 ml  Net 1095 ml   Filed Weights   08/10/23 1318  Weight: 57.2 kg    Exam:  General: 62 y.o. year-old female well developed well nourished in no acute distress.  Alert and oriented x3. Cardiovascular: Regular rate and rhythm with no rubs or gallops.  No thyromegaly or JVD noted.   Respiratory: Clear to auscultation with no wheezes or rales. Good inspiratory effort. Abdomen: Soft nontender nondistended with normal bowel sounds x4 quadrants. Musculoskeletal: No lower extremity edema. 2/4 pulses in all 4 extremities. Skin: R wrist in surgical dressing Psychiatry: Mood is appropriate for condition and setting   Data Reviewed: CBC: Recent Labs  Lab 08/10/23 1441 08/11/23 0431  WBC 7.4 4.8  NEUTROABS 5.3  --   HGB 14.3 12.4  HCT 42.4 35.5*  MCV 90.6 88.3  PLT 191 168   Basic Metabolic Panel: Recent Labs  Lab 08/10/23 1441 08/11/23 0431  NA 137 137  K 3.4* 4.6  CL 101 106  CO2 25 24  GLUCOSE 85 151*  BUN 13 13  CREATININE 0.58 0.59  CALCIUM 9.2  8.5*   GFR: Estimated Creatinine Clearance: 60.3 mL/min (by C-G formula based on SCr of 0.59 mg/dL). Liver Function Tests: Recent Labs  Lab 08/11/23 0431  AST 14*  ALT 14  ALKPHOS 51  BILITOT 0.6  PROT 5.9*  ALBUMIN 3.2*   No results for input(s): "LIPASE", "AMYLASE" in the last 168 hours. No results for input(s): "AMMONIA" in the last 168 hours. Coagulation Profile: No results for input(s): "INR", "PROTIME" in the last 168 hours. Cardiac Enzymes: No results for input(s): "CKTOTAL", "CKMB", "CKMBINDEX", "TROPONINI" in the last 168 hours. BNP (last 3 results) No results for input(s): "PROBNP" in the last 8760 hours. HbA1C: No results for input(s): "HGBA1C" in the last 72 hours. CBG: No results for input(s):  "GLUCAP" in the last 168 hours. Lipid Profile: No results for input(s): "CHOL", "HDL", "LDLCALC", "TRIG", "CHOLHDL", "LDLDIRECT" in the last 72 hours. Thyroid Function Tests: No results for input(s): "TSH", "T4TOTAL", "FREET4", "T3FREE", "THYROIDAB" in the last 72 hours. Anemia Panel: No results for input(s): "VITAMINB12", "FOLATE", "FERRITIN", "TIBC", "IRON", "RETICCTPCT" in the last 72 hours. Urine analysis: No results found for: "COLORURINE", "APPEARANCEUR", "LABSPEC", "PHURINE", "GLUCOSEU", "HGBUR", "BILIRUBINUR", "KETONESUR", "PROTEINUR", "UROBILINOGEN", "NITRITE", "LEUKOCYTESUR" Sepsis Labs: @LABRCNTIP (procalcitonin:4,lacticidven:4)  ) Recent Results (from the past 240 hour(s))  Culture, blood (routine x 2)     Status: None (Preliminary result)   Collection Time: 08/10/23  2:40 PM   Specimen: BLOOD  Result Value Ref Range Status   Specimen Description BLOOD BLOOD LEFT ARM  Final   Special Requests   Final    BOTTLES DRAWN AEROBIC AND ANAEROBIC Blood Culture results may not be optimal due to an excessive volume of blood received in culture bottles   Culture   Final    NO GROWTH < 24 HOURS Performed at [facility], [address] [place], [place] [address]    Report Status PENDING  Incomplete  Culture, blood (routine x 2)     Status: None (Preliminary result)   Collection Time: 08/10/23  2:41 PM   Specimen: BLOOD  Result Value Ref Range Status   Specimen Description BLOOD BLOOD LEFT HAND  Final   Special Requests   Final    BOTTLES DRAWN AEROBIC AND ANAEROBIC Blood Culture results may not be optimal due to an inadequate volume of blood received in culture bottles   Culture   Final    NO GROWTH < 24 HOURS Performed at [facility], [address], [place], [place] [address]    Report Status PENDING  Incomplete      Studies: No results found.  Scheduled Meds:  arformoterol  15 mcg Nebulization BID   citalopram  40 mg Oral Daily   enoxaparin  (LOVENOX) injection  40 mg Subcutaneous Q24H   nicotine  21 mg Transdermal Daily    Continuous Infusions:  sodium chloride 100 mL/hr at 08/10/23 1855   levofloxacin (LEVAQUIN) IV Stopped (08/10/23 2240)   metronidazole Stopped (08/11/23 1105)     LOS: 1 day     Darlin Drop, MD [facility] Pager [phone]  If 7PM-7AM, please contact night-coverage [email or web address] Password [email or web address] 08/11/2023, 4:03 PM

## 2023-08-11 NOTE — Plan of Care (Signed)

## 2023-08-11 NOTE — Progress Notes (Signed)
Orthopedic progress note  Chief complaint right wrist abscess  Subjective, no acute events overnight.  She was in stockinette elevation  Objective, chart reviewed White count 4.8 this morning Baseline CRP from yesterday pending AM CRP pending   Examination The bandage was removed.  The packing was pulled.  The overall wound looks better.  The swelling is resolved.  She can move the fingers.  There is no evidence of pus or ongoing infection in the wound.  Small amount of bleeding from the wound is present.  She started warm soaks at the baseline.  Assessment and plan: 62 year old right-hand-dominant female who is postoperative day #1 status post washout of right wrist abscess from cat bite injury.  She overall is doing well.  Her packing was removed and she is starting the warm soaks today.  Warm soaks should be every 2 hours for 20 minutes.  She has a basin at bedside.  The patient and her husband were educated on how to do the warm soaks.  In between soaks the wound could be covered with gauze and tape.  At the end of the day after the last soak a bulky gauze bandage can be placed case there is any drainage overnight.  Recommend discharge when her CRP decreases 2 days in a row and the clinical exam improves.  The results of the cultures should dictate the appropriate p.o. antibiotic upon discharge.  Her clinical exam is already improved.  Upon discharge the patient should take the basin home to continue the warm soaks.  She does not need to be in stockinette elevation anymore.    I am off service today.  Please page orthopedics with any questions or concerns.  Thank you.   Nada Boozer, [facility]

## 2023-08-12 DIAGNOSIS — W5501XA Bitten by cat, initial encounter: Secondary | ICD-10-CM | POA: Diagnosis not present

## 2023-08-12 DIAGNOSIS — S61551A Open bite of right wrist, initial encounter: Secondary | ICD-10-CM | POA: Diagnosis not present

## 2023-08-12 LAB — CBC
HCT: 34.9 % — ABNORMAL LOW (ref 36.0–46.0)
Hemoglobin: 11.9 g/dL — ABNORMAL LOW (ref 12.0–15.0)
MCH: 30.7 pg (ref 26.0–34.0)
MCHC: 34.1 g/dL (ref 30.0–36.0)
MCV: 89.9 fL (ref 80.0–100.0)
Platelets: 130 10*3/uL — ABNORMAL LOW (ref 150–400)
RBC: 3.88 MIL/uL (ref 3.87–5.11)
RDW: 12.1 % (ref 11.5–15.5)
WBC: 4.6 10*3/uL (ref 4.0–10.5)
nRBC: 0 % (ref 0.0–0.2)

## 2023-08-12 LAB — C-REACTIVE PROTEIN: CRP: 1.8 mg/dL — ABNORMAL HIGH (ref <1.0)

## 2023-08-12 MED ORDER — MORPHINE SULFATE (PF) 2 MG/ML IV SOLN: 2.0000 mg | INTRAVENOUS | Status: DC | PRN

## 2023-08-12 MED ORDER — ACETAMINOPHEN 325 MG PO TABS: 650.0000 mg | ORAL_TABLET | Freq: Four times a day (QID) | ORAL | Status: DC | PRN

## 2023-08-12 MED ORDER — OXYCODONE HCL 5 MG PO TABS: 5.0000 mg | ORAL_TABLET | ORAL | 0 refills | Status: DC | PRN

## 2023-08-12 MED ORDER — TRAMADOL HCL 50 MG PO TABS: 50.0000 mg | ORAL_TABLET | Freq: Four times a day (QID) | ORAL | Status: DC | PRN

## 2023-08-12 MED ORDER — METRONIDAZOLE 500 MG PO TABS
500.0000 mg | ORAL_TABLET | Freq: Two times a day (BID) | ORAL | 0 refills | Status: AC
Start: 2023-08-12 — End: 2023-08-22

## 2023-08-12 MED ORDER — LEVOFLOXACIN 750 MG PO TABS
750.0000 mg | ORAL_TABLET | Freq: Every day | ORAL | 0 refills | Status: AC
Start: 2023-08-12 — End: 2023-08-22

## 2023-08-12 NOTE — Plan of Care (Signed)
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
## 2023-08-12 NOTE — Discharge Summary (Signed)
Physician Discharge Summary   Patient: Nichole Hogan MRN: [id number] DOB: [birth year]  Admit date:     08/10/2023  Discharge date: 08/12/2023  Discharge Physician: Pennie Banter   PCP: Hannah Beat, MD   Recommendations at discharge:    Follow up with Orthopedic surgery Follow up with Primary Care  Repeat CBC, BMP at follow up Monitor cat bite site for ongoing improvement / resolution after antibiotics complete  Discharge Diagnoses: Principal Problem:   Cat bite of right wrist Active Problems:   COPD (chronic obstructive pulmonary disease) (HCC)   GERD  Resolved Problems:   * No resolved hospital problems. *  Hospital Course:  Nichole Hogan is a 62 y.o. female with medical history significant of tobacco abuse, COPD, GERD, chronic anxiety presenting with cat bite of the right wrist roughly 1 week ago.  Associated with worsening swelling and redness.  Was seen by urgent care and started on course of doxycycline.  Pain progressed and worsened.  Baseline 2 pack/day smoker.  Does drink a fair amount of liquor daily though she has not drank in the last week.  Work up in the ED revealed R wrist abscess post cat bite.  IV Levaquin and IV Flagyl were initiated.  Seen by ortho.  Post I&D on 08/10/23.   08/12/23: pt doing well today, seen with husband at bedside. No fever/chills.  Pain controlled.  Pt is clinically improved, cleared by ortho service and agreeable / requesting discharge home today. We discussed final culture results still pending, but given ongoing improvement, continuing current antibiotics in oral form. Pt is agreeable and understands there is a  slight risk of final cultures showing resistance, would call in new antibiotic if that were the case.  Pt agreeable to that plan.   Assessment and Plan: Cat bite of right wrist R wrist abscess seen on MRI post I&D 08/10/23 Dr. Clemencia Course Treated with IV Levaquin and IV Flagyl (penicillin allergy) - excellent clinical repsonse Discharge  on PO Levaquin and PO Flagyl x 10 more days Dr. Clemencia Course with orthopedic surgery consulted - outpatient follow up recommended Blood cx x2 - negative to date. Follow OR cultures to final - growing rare staph aureus thus far   GERD Continue PPI    COPD (chronic obstructive pulmonary disease) (HCC) C/w with PRN bronchodilators Not requiring O2 supplementation   Tobacco use disorder  Nicotine patch       Consultants: Ortho Procedures performed: surgical I&D  Disposition: Home Diet recommendation:  Regular diet DISCHARGE MEDICATION: Allergies as of 08/12/2023       Reactions   Humibid Dm Itching   Penicillins Hives, Swelling   throat swelling, rash and hives TOLERATED CEFAZOLIN 08/10/23   Mobic [meloxicam]    Voltaren [diclofenac Sodium]    Face swelling and SOB with rash/        Medication List     TAKE these medications    acetaminophen 325 MG tablet Commonly known as: TYLENOL Take 2 tablets (650 mg total) by mouth every 6 (six) hours as needed for mild pain, fever or headache.   ALPRAZolam 0.5 MG tablet Commonly known as: XANAX TAKE 1 TABLET BY MOUTH THREE TIMES A DAY AS NEEDED FOR ANXIETY   citalopram 40 MG tablet Commonly known as: CELEXA TAKE 1 TABLET BY MOUTH EVERY DAY   levofloxacin 750 MG tablet Commonly known as: Levaquin Take 1 tablet (750 mg total) by mouth daily for 10 days.   metroNIDAZOLE 500 MG tablet Commonly known as:  Flagyl Take 1 tablet (500 mg total) by mouth 2 (two) times daily for 10 days.   oxyCODONE 5 MG immediate release tablet Commonly known as: Oxy IR/ROXICODONE Take 1 tablet (5 mg total) by mouth every 4 (four) hours as needed for moderate pain, severe pain or breakthrough pain.   Serevent Diskus 50 MCG/ACT diskus inhaler Generic drug: salmeterol INHALE 1 PUFF BY MOUTH EVERY 12 HOURS        Follow-up Information     Zafonte, Adelfa Koh, MD. Schedule an appointment as soon as possible for a visit.   Specialty:  Orthopedic Surgery Why: Follow up early next week Contact information: [address] [place] [address] [phone]         Hannah Beat, MD. Call.   Specialties: Family Medicine, Sports Medicine Why: Call for hospital follow up Contact information: [address] [place] [address] [phone]                Discharge Exam: Ceasar Mons Weights   08/10/23 1318  Weight: 57.2 kg   General exam: awake, alert, no acute distress HEENT: atraumatic, clear conjunctiva, anicteric sclera, moist mucus membranes, hearing grossly normal  Respiratory system: CTAB, no wheezes, rales or rhonchi, normal respiratory effort. Cardiovascular system: normal S1/S2, RRR, no JVD, murmurs, rubs, gallops, no pedal edema.   Gastrointestinal system: soft, NT, ND, no HSM felt, +bowel sounds. Central nervous system: A&O x 4. no gross focal neurologic deficits, normal speech Extremities: right dorsal hand scab intact over site of bite, no warmth or erythema surround the site, mildly tender around site with palpation Skin: dry, intact, normal temperature Psychiatry: normal mood, congruent affect, judgement and insight appear normal   Condition at discharge: stable  The results of significant diagnostics from this hospitalization (including imaging, microbiology, ancillary and laboratory) are listed below for reference.   Imaging Studies: MR WRIST RIGHT W WO CONTRAST  Result Date: 08/10/2023 CLINICAL DATA:  Swelling, decreased range of motion. Recent cat bite. On antibiotics EXAM: MR OF THE RIGHT WRIST WITHOUT AND WITH CONTRAST TECHNIQUE: Multiplanar multisequence MR imaging of the right wrist was performed both before and after the administration of intravenous contrast. CONTRAST:  5mL GADAVIST GADOBUTROL 1 MMOL/ML IV SOLN COMPARISON:  None Available. FINDINGS: Ligaments: Intact scapholunate and lunotriquetral ligaments. Triangular fibrocartilage: Intact TFCC. Tendons:  Severe tenosynovitis of the fourth extensor compartment tendons of the wrist extending into the hand. Fluid distended tendon sheath measures up to 1.9 x 1.2 cm in transaxial dimension and extends at least 12 cm in length. Flexor and extensor tendons are otherwise intact. No tendon tear. Carpal tunnel/median nerve: Normal carpal tunnel. Normal median nerve. Guyon's canal: Normal. Joint/cartilage: Severe osteoarthritis of the first [facility] joint with high-grade chondral loss, marginal osteophyte formation and associated subchondral bone marrow edema. No joint effusion or synovitis. No erosions. Bones/carpal alignment: No acute fracture. No dislocation. Normal alignment. No suspicious osseous lesion. Other: Soft tissue swelling and enhancement of the dorsal hand and wrist compatible with cellulitis. IMPRESSION: 1. Severe tenosynovitis of the fourth extensor compartment tendons of the wrist extending into the hand. This is likely infectious tenosynovitis given the history. 2. Soft tissue swelling and enhancement of the dorsal hand and wrist compatible with cellulitis. 3. No evidence of septic arthritis or osteomyelitis. 4. Severe osteoarthritis of the first [facility] joint. Electronically Signed   By: Duanne Guess D.O.   On: 08/10/2023 16:31    Microbiology: Results for orders placed or performed during the hospital  encounter of 08/10/23  Culture, blood (routine x 2)     Status: None (Preliminary result)   Collection Time: 08/10/23  2:40 PM   Specimen: BLOOD  Result Value Ref Range Status   Specimen Description BLOOD BLOOD LEFT ARM  Final   Special Requests   Final    BOTTLES DRAWN AEROBIC AND ANAEROBIC Blood Culture results may not be optimal due to an excessive volume of blood received in culture bottles   Culture   Final    NO GROWTH 2 DAYS Performed at [facility], [address], [place], [place] [address]    Report Status PENDING  Incomplete  Culture, blood (routine x 2)     Status: None  (Preliminary result)   Collection Time: 08/10/23  2:41 PM   Specimen: BLOOD  Result Value Ref Range Status   Specimen Description BLOOD BLOOD LEFT HAND  Final   Special Requests   Final    BOTTLES DRAWN AEROBIC AND ANAEROBIC Blood Culture results may not be optimal due to an inadequate volume of blood received in culture bottles   Culture   Final    NO GROWTH 2 DAYS Performed at [facility], [address], [place], [place] [address]    Report Status PENDING  Incomplete  Aerobic/Anaerobic Culture w Gram Stain (surgical/deep wound)     Status: None (Preliminary result)   Collection Time: 08/10/23  7:34 PM   Specimen: Path fluid; Body Fluid  Result Value Ref Range Status   Specimen Description   Final    ABSCESS RIGHT WRIST Performed at [facility], [address] [place], [place] [address]    Special Requests   Final    NONE Performed at [facility], [address] [place], [place] [address]    Gram Stain NO WBC SEEN NO ORGANISMS SEEN   Final   Culture   Final    RARE STAPHYLOCOCCUS AUREUS SUSCEPTIBILITIES TO FOLLOW Performed at [facility], [address] [place], [place] [address]    Report Status PENDING  Incomplete  Aerobic/Anaerobic Culture w Gram Stain (surgical/deep wound)     Status: None (Preliminary result)   Collection Time: 08/10/23  7:35 PM   Specimen: Path fluid; Body Fluid  Result Value Ref Range Status   Specimen Description   Final    ABSCESS RIGHT WRIST Performed at [facility], [address], [place], [place] [address]    Special Requests   Final    NONE Performed at [facility], [address] [place], [place] [address]    Gram Stain   Final    NO WBC SEEN RARE GRAM POSITIVE COCCI Performed at [facility], [address] [place], [place] [address]    Culture FEW STAPHYLOCOCCUS AUREUS  Final   Report Status PENDING  Incomplete    Labs: CBC: Recent Labs  Lab  08/10/23 1441 08/11/23 0431 08/12/23 0445  WBC 7.4 4.8 4.6  NEUTROABS 5.3  --   --   HGB 14.3 12.4 11.9*  HCT 42.4 35.5* 34.9*  MCV 90.6 88.3 89.9  PLT 191 168 130*   Basic Metabolic Panel: Recent Labs  Lab 08/10/23 1441 08/11/23 0431  NA 137 137  K 3.4* 4.6  CL 101 106  CO2 25 24  GLUCOSE 85 151*  BUN 13 13  CREATININE 0.58 0.59  CALCIUM 9.2 8.5*   Liver Function Tests: Recent Labs  Lab 08/11/23 0431  AST 14*  ALT  14  ALKPHOS 51  BILITOT 0.6  PROT 5.9*  ALBUMIN 3.2*   CBG: No results for input(s): "GLUCAP" in the last 168 hours.  Discharge time spent: less than 30 minutes.  Signed: Pennie Banter, DO [facility] 08/12/2023

## 2023-08-14 ENCOUNTER — Telehealth: Payer: Self-pay

## 2023-08-14 NOTE — Transitions of Care (Post Inpatient/ED Visit) (Signed)
08/14/2023  Name: Nichole Hogan MRN: [id number] DOB: 1961-02-02  Today's TOC FU Call Status: Today's TOC FU Call Status:: Successful TOC FU Call Completed TOC FU Call Complete Date: 08/14/23 Patient's Name and Date of Birth confirmed.  Transition Care Management Follow-up Telephone Call Date of Discharge: 08/12/23 Discharge Facility: [facility]) Type of Discharge: Inpatient Admission Primary Inpatient Discharge Diagnosis:: 'cat bite" How have you been since you were released from the hospital?: Better (Pt states area is healing and looking better. She reports minimal to no pain-haven't had to take anything lately. She is taking abxs-they were causing some GI upset but has resolved-she is eating before taking med.) Any questions or concerns?: No  Items Reviewed: Did you receive and understand the discharge instructions provided?: Yes Medications obtained,verified, and reconciled?: Yes (Medications Reviewed) Any new allergies since your discharge?: No Dietary orders reviewed?: Yes Type of Diet Ordered:: low salt/heart healthy Do you have support at home?: Yes People in Home: spouse Name of Support/Comfort Primary Source: Public house manager  Medications Reviewed Today: Medications Reviewed Today     Reviewed by Charlyn Minerva, RN (Registered Nurse) on 08/14/23 at 1130  Med List Status: <None>   Medication Order Taking? Sig Documenting Provider Last Dose Status Informant  acetaminophen (TYLENOL) 325 MG tablet [id number] Yes Take 2 tablets (650 mg total) by mouth every 6 (six) hours as needed for mild pain, fever or headache. Pennie Banter, DO Taking Active   ALPRAZolam Prudy Feeler) 0.5 MG tablet [id number] Yes TAKE 1 TABLET BY MOUTH THREE TIMES A DAY AS NEEDED FOR ANXIETY Copland, Spencer, MD Taking Active Self  citalopram (CELEXA) 40 MG tablet [id number] Yes TAKE 1 TABLET BY MOUTH EVERY DAY Copland, Spencer, MD Taking Active Self  levofloxacin (LEVAQUIN) 750 MG  tablet [id number] Yes Take 1 tablet (750 mg total) by mouth daily for 10 days. Pennie Banter, DO Taking Active   metroNIDAZOLE (FLAGYL) 500 MG tablet [id number] Yes Take 1 tablet (500 mg total) by mouth 2 (two) times daily for 10 days. Pennie Banter, DO Taking Active   oxyCODONE (OXY IR/ROXICODONE) 5 MG immediate release tablet [id number] Yes Take 1 tablet (5 mg total) by mouth every 4 (four) hours as needed for moderate pain, severe pain or breakthrough pain. Pennie Banter, DO Taking Active   salmeterol (SEREVENT DISKUS) 50 MCG/ACT diskus inhaler [id number] Yes INHALE 1 PUFF BY MOUTH EVERY 12 HOURS Copland, Spencer, MD Taking Active Self            Home Care and Equipment/Supplies: Were Home Health Services Ordered?: NA Any new equipment or medical supplies ordered?: NA  Functional Questionnaire: Do you need assistance with bathing/showering or dressing?: No Do you need assistance with meal preparation?: No Do you need assistance with eating?: No Do you have difficulty maintaining continence: No Do you need assistance with getting out of bed/getting out of a chair/moving?: No Do you have difficulty managing or taking your medications?: No  Follow up appointments reviewed: PCP Follow-up appointment confirmed?: Yes Date of PCP follow-up appointment?: 08/18/23 Follow-up Provider: Dr. Ermalene Searing Specialist [facility] Follow-up appointment confirmed?: Yes Date of Specialist follow-up appointment?: 08/20/23 Follow-Up Specialty Provider:: Dr. Clemencia Course Do you need transportation to your follow-up appointment?: No (pt states she is able to drive herself to appts) Do you understand care options if your condition(s) worsen?: Yes-patient verbalized understanding  SDOH Interventions Today    Flowsheet Row Most Recent Value  SDOH Interventions   Food Insecurity Interventions Intervention Not Indicated  Transportation Interventions Intervention Not Indicated      TOC Interventions Today     Flowsheet Row Most Recent Value  TOC Interventions   TOC Interventions Discussed/Reviewed TOC Interventions Discussed, S/S of infection      Interventions Today    Flowsheet Row Most Recent Value  General Interventions   General Interventions Discussed/Reviewed General Interventions Discussed, Doctor Visits  Doctor Visits Discussed/Reviewed Doctor Visits Discussed, PCP, Specialist  PCP/Specialist Visits Compliance with follow-up visit  Education Interventions   Education Provided Provided Education  Provided Verbal Education On Other, Nutrition, When to see the doctor  Nutrition Interventions   Nutrition Discussed/Reviewed Nutrition Discussed  Pharmacy Interventions   Pharmacy Dicussed/Reviewed Pharmacy Topics Discussed, Medications and their functions  Safety Interventions   Safety Discussed/Reviewed Safety Discussed        Alessandra Grout [facility] Care Management Care Management Community Coordinator Direct Phone: [phone] Toll Free: [phone] Fax: [phone]

## 2023-08-15 LAB — CULTURE, BLOOD (ROUTINE X 2)
Culture: NO GROWTH
Culture: NO GROWTH

## 2023-08-16 LAB — AEROBIC/ANAEROBIC CULTURE W GRAM STAIN (SURGICAL/DEEP WOUND)
Gram Stain: NONE SEEN
Gram Stain: NONE SEEN

## 2023-08-18 ENCOUNTER — Ambulatory Visit (INDEPENDENT_AMBULATORY_CARE_PROVIDER_SITE_OTHER): Payer: BC Managed Care – PPO | Admitting: Family Medicine

## 2023-08-18 ENCOUNTER — Encounter: Payer: Self-pay | Admitting: Family Medicine

## 2023-08-18 VITALS — BP 110/76 | HR 87 | Temp 98.0°F | Ht 63.0 in | Wt 117.5 lb

## 2023-08-18 DIAGNOSIS — W5501XA Bitten by cat, initial encounter: Secondary | ICD-10-CM | POA: Diagnosis not present

## 2023-08-18 DIAGNOSIS — S61551S Open bite of right wrist, sequela: Secondary | ICD-10-CM | POA: Diagnosis not present

## 2023-08-18 DIAGNOSIS — S61551A Open bite of right wrist, initial encounter: Secondary | ICD-10-CM | POA: Diagnosis not present

## 2023-08-18 DIAGNOSIS — L03113 Cellulitis of right upper limb: Secondary | ICD-10-CM

## 2023-08-18 DIAGNOSIS — W5501XS Bitten by cat, sequela: Secondary | ICD-10-CM | POA: Diagnosis not present

## 2023-08-18 NOTE — Progress Notes (Addendum)
Patient ID: Nichole Hogan, female    DOB: [birth year], 62 y.o.   MRN: [id number]  This visit was conducted in person.  BP 110/76 (BP Location: Left Arm, Patient Position: Sitting, Cuff Size: Normal)   Pulse 87   Temp 98 F (36.7 C) (Temporal)   Ht 5\' 3"  (1.6 m)   Wt 117 lb 8 oz (53.3 kg)   LMP 11/03/2012   SpO2 97%   BMI 20.81 kg/m    CC:  Chief Complaint  Patient presents with   Hospitalization Follow-up   Dizziness    Subjective:   HPI: Nichole Hogan is a 63 y.o. female patient of Dr. Patsy Lager presenting on 08/18/2023 for Hospitalization Follow-up and Dizziness  Reviewed hospital discharge summary Admitted August 10, 2023 Discharged September 4th, 2024  Hospitalized for cat bite to the right wrist with associated cellulitis that had not improved with oral course of doxycycline. Workup in the emergency department revealed a right wrist abscess.  She was treated with IV Levaquin and IV Flagyl. Orthopedics proceeded with incision and drainage August 10, 2023 Sent home with prescription for levofloxacin 750 mg p.o. daily and methimazole 500 mg p.o. twice daily. Pain controlled with oxycodone 5 mg every 4 hours as needed  On review of labs blood culture from September 2 returned negative, culture of wound returned showing rare Staph aureus pansensitive    Today she reports  she has been soaking the wound frequently.. Minimal  pain. Has not had to use oxycodone.   She has noted some dizziness when waking in AM.  No fever, no chills. Minimal redness, no discharge.  She has has some diarrhea, drinking 16 oz of water each day.  Day 6 of antibiotics.  Has follow up with ortho in 2 days.  Given Tdap on 08/10/23  Relevant past medical, surgical, family and social history reviewed and updated as indicated. Interim medical history since our last visit reviewed. Allergies and medications reviewed and updated. Outpatient Medications Prior to Visit  Medication Sig Dispense  Refill   acetaminophen (TYLENOL) 325 MG tablet Take 2 tablets (650 mg total) by mouth every 6 (six) hours as needed for mild pain, fever or headache.     ALPRAZolam (XANAX) 0.5 MG tablet TAKE 1 TABLET BY MOUTH THREE TIMES A DAY AS NEEDED FOR ANXIETY 60 tablet 1   citalopram (CELEXA) 40 MG tablet TAKE 1 TABLET BY MOUTH EVERY DAY 90 tablet 3   levofloxacin (LEVAQUIN) 750 MG tablet Take 1 tablet (750 mg total) by mouth daily for 10 days. 10 tablet 0   metroNIDAZOLE (FLAGYL) 500 MG tablet Take 1 tablet (500 mg total) by mouth 2 (two) times daily for 10 days. 20 tablet 0   oxyCODONE (OXY IR/ROXICODONE) 5 MG immediate release tablet Take 1 tablet (5 mg total) by mouth every 4 (four) hours as needed for moderate pain, severe pain or breakthrough pain. 20 tablet 0   salmeterol (SEREVENT DISKUS) 50 MCG/ACT diskus inhaler INHALE 1 PUFF BY MOUTH EVERY 12 HOURS 180 each 1   No facility-administered medications prior to visit.     Per HPI unless specifically indicated in ROS section below Review of Systems  Constitutional:  Negative for fatigue and fever.  HENT:  Negative for congestion.   Eyes:  Negative for pain.  Respiratory:  Negative for cough and shortness of breath.   Cardiovascular:  Negative for chest pain, palpitations and leg swelling.  Gastrointestinal:  Negative for abdominal pain.  Genitourinary:  Negative  for dysuria and vaginal bleeding.  Musculoskeletal:  Negative for back pain.  Neurological:  Negative for syncope, light-headedness and headaches.  Psychiatric/Behavioral:  Negative for dysphoric mood.    Objective:  BP 110/76 (BP Location: Left Arm, Patient Position: Sitting, Cuff Size: Normal)   Pulse 87   Temp 98 F (36.7 C) (Temporal)   Ht 5\' 3"  (1.6 m)   Wt 117 lb 8 oz (53.3 kg)   LMP 11/03/2012   SpO2 97%   BMI 20.81 kg/m   Wt Readings from Last 3 Encounters:  08/18/23 117 lb 8 oz (53.3 kg)  08/10/23 126 lb (57.2 kg)  09/08/22 126 lb 8 oz (57.4 kg)      Physical  Exam Constitutional:      General: She is not in acute distress.    Appearance: Normal appearance. She is well-developed. She is not ill-appearing or toxic-appearing.  HENT:     Head: Normocephalic.     Right Ear: Hearing, tympanic membrane, ear canal and external ear normal. Tympanic membrane is not erythematous, retracted or bulging.     Left Ear: Hearing, tympanic membrane, ear canal and external ear normal. Tympanic membrane is not erythematous, retracted or bulging.     Nose: No mucosal edema or rhinorrhea.     Right Sinus: No maxillary sinus tenderness or frontal sinus tenderness.     Left Sinus: No maxillary sinus tenderness or frontal sinus tenderness.     Mouth/Throat:     Mouth: Oropharynx is clear and moist and mucous membranes are normal.     Pharynx: Uvula midline.  Eyes:     General: Lids are normal. Lids are everted, no foreign bodies appreciated.     Extraocular Movements: EOM normal.     Conjunctiva/sclera: Conjunctivae normal.     Pupils: Pupils are equal, round, and reactive to light.  Neck:     Thyroid: No thyroid mass or thyromegaly.     Vascular: No carotid bruit.     Trachea: Trachea normal.  Cardiovascular:     Rate and Rhythm: Normal rate and regular rhythm.     Pulses: Normal pulses.     Heart sounds: Normal heart sounds, S1 normal and S2 normal. No murmur heard.    No friction rub. No gallop.  Pulmonary:     Effort: Pulmonary effort is normal. No tachypnea or respiratory distress.     Breath sounds: Normal breath sounds. No decreased breath sounds, wheezing, rhonchi or rales.  Abdominal:     General: Bowel sounds are normal.     Palpations: Abdomen is soft.     Tenderness: There is no abdominal tenderness.  Musculoskeletal:     Cervical back: Normal range of motion and neck supple.  Skin:    General: Skin is warm, dry and intact.     Findings: Wound present.     Comments: See photo  Neurological:     Mental Status: She is alert.  Psychiatric:         Mood and Affect: Mood is not anxious or depressed.        Speech: Speech normal.        Behavior: Behavior normal. Behavior is cooperative.        Thought Content: Thought content normal.        Cognition and Memory: Cognition and memory normal.        Judgment: Judgment normal.       Results for orders placed or performed during the hospital encounter of 08/10/23  Culture, blood (routine x 2)   Specimen: BLOOD  Result Value Ref Range   Specimen Description BLOOD BLOOD LEFT HAND    Special Requests      BOTTLES DRAWN AEROBIC AND ANAEROBIC Blood Culture results may not be optimal due to an inadequate volume of blood received in culture bottles   Culture      NO GROWTH 5 DAYS Performed at [facility], [address] [place], [place] [address]    Report Status 08/15/2023 FINAL   Culture, blood (routine x 2)   Specimen: BLOOD  Result Value Ref Range   Specimen Description BLOOD BLOOD LEFT ARM    Special Requests      BOTTLES DRAWN AEROBIC AND ANAEROBIC Blood Culture results may not be optimal due to an excessive volume of blood received in culture bottles   Culture      NO GROWTH 5 DAYS Performed at [facility], [address], [place], [place] [address]    Report Status 08/15/2023 FINAL   Aerobic/Anaerobic Culture w Gram Stain (surgical/deep wound)   Specimen: Path fluid; Body Fluid  Result Value Ref Range   Specimen Description      ABSCESS RIGHT WRIST Performed at [facility], [address] [place], [place] [address]    Special Requests      NONE Performed at [facility], [address] [place], [place] [address]    Gram Stain NO WBC SEEN NO ORGANISMS SEEN     Culture      RARE STAPHYLOCOCCUS AUREUS NO ANAEROBES ISOLATED Performed at [facility], [address] [place], [place] [address]    Report Status 08/16/2023 FINAL    Organism ID, Bacteria STAPHYLOCOCCUS AUREUS       Susceptibility   Staphylococcus  aureus - MIC*    CIPROFLOXACIN <=0.5 SENSITIVE Sensitive     ERYTHROMYCIN <=0.25 SENSITIVE Sensitive     GENTAMICIN <=0.5 SENSITIVE Sensitive     OXACILLIN <=0.25 SENSITIVE Sensitive     TETRACYCLINE <=1 SENSITIVE Sensitive     VANCOMYCIN <=0.5 SENSITIVE Sensitive     TRIMETH/SULFA <=10 SENSITIVE Sensitive     CLINDAMYCIN <=0.25 SENSITIVE Sensitive     RIFAMPIN <=0.5 SENSITIVE Sensitive     Inducible Clindamycin NEGATIVE Sensitive     LINEZOLID 2 SENSITIVE Sensitive     * RARE STAPHYLOCOCCUS AUREUS  Aerobic/Anaerobic Culture w Gram Stain (surgical/deep wound)   Specimen: Path fluid; Body Fluid  Result Value Ref Range   Specimen Description      ABSCESS RIGHT WRIST Performed at [facility], [address] [place], [place] [address]    Special Requests      NONE Performed at [facility], [address] [place], [place] [address]    Gram Stain NO WBC SEEN RARE GRAM POSITIVE COCCI     Culture      FEW STAPHYLOCOCCUS AUREUS SUSCEPTIBILITIES PERFORMED ON PREVIOUS CULTURE WITHIN THE LAST 5 DAYS. NO ANAEROBES ISOLATED Performed at [facility], [address], [place], [place] [address]    Report Status 08/16/2023 FINAL   Basic metabolic panel  Result Value Ref Range   Sodium 137 135 - 145 mmol/L   Potassium 3.4 (L) 3.5 - 5.1 mmol/L   Chloride 101 98 - 111 mmol/L   CO2 25 22 - 32 mmol/L   Glucose, Bld 85 70 - 99 mg/dL   BUN 13 8 - 23 mg/dL   Creatinine, Ser 9.32 0.44 - 1.00 mg/dL  Calcium 9.2 8.9 - 10.3 mg/dL   GFR, Estimated >53 >66 mL/min   Anion gap 11 5 - 15  CBC with Differential  Result Value Ref Range   WBC 7.4 4.0 - 10.5 K/uL   RBC 4.68 3.87 - 5.11 MIL/uL   Hemoglobin 14.3 12.0 - 15.0 g/dL   HCT 44.0 34.7 - 42.5 %   MCV 90.6 80.0 - 100.0 fL   MCH 30.6 26.0 - 34.0 pg   MCHC 33.7 30.0 - 36.0 g/dL   RDW 95.6 38.7 - 56.4 %   Platelets 191 150 - 400 K/uL   nRBC 0.0 0.0 - 0.2 %   Neutrophils Relative % 72 %   Neutro Abs 5.3 1.7 - 7.7  K/uL   Lymphocytes Relative 16 %   Lymphs Abs 1.2 0.7 - 4.0 K/uL   Monocytes Relative 10 %   Monocytes Absolute 0.7 0.1 - 1.0 K/uL   Eosinophils Relative 2 %   Eosinophils Absolute 0.2 0.0 - 0.5 K/uL   Basophils Relative 0 %   Basophils Absolute 0.0 0.0 - 0.1 K/uL   Immature Granulocytes 0 %   Abs Immature Granulocytes 0.03 0.00 - 0.07 K/uL  Lactic acid, plasma  Result Value Ref Range   Lactic Acid, Venous 0.9 0.5 - 1.9 mmol/L  HIV Antibody (routine testing w rflx)  Result Value Ref Range   HIV Screen 4th Generation wRfx Non Reactive Non Reactive  CBC  Result Value Ref Range   WBC 4.8 4.0 - 10.5 K/uL   RBC 4.02 3.87 - 5.11 MIL/uL   Hemoglobin 12.4 12.0 - 15.0 g/dL   HCT 33.2 (L) 95.1 - 88.4 %   MCV 88.3 80.0 - 100.0 fL   MCH 30.8 26.0 - 34.0 pg   MCHC 34.9 30.0 - 36.0 g/dL   RDW 16.6 06.3 - 01.6 %   Platelets 168 150 - 400 K/uL   nRBC 0.0 0.0 - 0.2 %  Comprehensive metabolic panel  Result Value Ref Range   Sodium 137 135 - 145 mmol/L   Potassium 4.6 3.5 - 5.1 mmol/L   Chloride 106 98 - 111 mmol/L   CO2 24 22 - 32 mmol/L   Glucose, Bld 151 (H) 70 - 99 mg/dL   BUN 13 8 - 23 mg/dL   Creatinine, Ser 0.10 0.44 - 1.00 mg/dL   Calcium 8.5 (L) 8.9 - 10.3 mg/dL   Total Protein 5.9 (L) 6.5 - 8.1 g/dL   Albumin 3.2 (L) 3.5 - 5.0 g/dL   AST 14 (L) 15 - 41 U/L   ALT 14 0 - 44 U/L   Alkaline Phosphatase 51 38 - 126 U/L   Total Bilirubin 0.6 0.3 - 1.2 mg/dL   GFR, Estimated >93 >23 mL/min   Anion gap 7 5 - 15  C-reactive protein  Result Value Ref Range   CRP 4.7 (H) <1.0 mg/dL  C-reactive protein  Result Value Ref Range   CRP 5.8 (H) <1.0 mg/dL  C-reactive protein  Result Value Ref Range   CRP 1.8 (H) <1.0 mg/dL  CBC  Result Value Ref Range   WBC 4.6 4.0 - 10.5 K/uL   RBC 3.88 3.87 - 5.11 MIL/uL   Hemoglobin 11.9 (L) 12.0 - 15.0 g/dL   HCT 55.7 (L) 32.2 - 02.5 %   MCV 89.9 80.0 - 100.0 fL   MCH 30.7 26.0 - 34.0 pg   MCHC 34.1 30.0 - 36.0 g/dL   RDW 42.7 06.2 - 37.6 %  Platelets 130 (L) 150 - 400 K/uL   nRBC 0.0 0.0 - 0.2 %    Assessment and Plan  Cat bite of right wrist, sequela  Cellulitis of arm, right  Acute, significant improvement in cellulitis and abscess after cat bite  on right wrist following incision and drainage and IV antibiotics.  Encouraged her to continue completion of 10-day course of levofloxacin and Flagyl as directed.  Dizziness may impart be due to diarrhea and decreased p.o. intake.  Encouraged her to increase water intake.  She can use probiotic to help mitigate diarrhea side effect of antibiotics.  Encouraged her to keep follow-up appointment with orthopedics.  She will continue soaking wrist but will space to every 6-8 hours.  Keep area clean dry and covered given location.  Up-to-date with Tdap.  No follow-ups on file.   Kerby Nora, MD

## 2023-08-18 NOTE — Patient Instructions (Signed)
Push water intake to avoid dehydration.  Continue antibiotics as prescribed. Can use probiotic if needed to avoid diarrhea. Continue to soak every 6-8 hours.  Keep area clean and dry, wrap.  Keep follow up with Ortho as planned.

## 2023-08-20 DIAGNOSIS — S61551A Open bite of right wrist, initial encounter: Secondary | ICD-10-CM | POA: Diagnosis not present

## 2023-08-20 DIAGNOSIS — L03113 Cellulitis of right upper limb: Secondary | ICD-10-CM | POA: Diagnosis not present

## 2023-08-20 DIAGNOSIS — W5501XA Bitten by cat, initial encounter: Secondary | ICD-10-CM | POA: Diagnosis not present

## 2023-08-27 ENCOUNTER — Ambulatory Visit: Payer: BC Managed Care – PPO | Admitting: Dermatology

## 2023-09-01 DIAGNOSIS — M25531 Pain in right wrist: Secondary | ICD-10-CM | POA: Diagnosis not present

## 2023-09-03 ENCOUNTER — Other Ambulatory Visit: Payer: Self-pay | Admitting: Family Medicine

## 2023-09-03 NOTE — Telephone Encounter (Signed)
Last OV- 08/18/23 (w/ Bedsole for Catbite), 09/08/22 for CPE  Last refilled- 05/06/23 #60 w/ 1 refill   NOV- 09/14/23 for CPE
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
## 2023-09-14 ENCOUNTER — Encounter: Payer: Self-pay | Admitting: Family Medicine

## 2023-09-14 ENCOUNTER — Ambulatory Visit (INDEPENDENT_AMBULATORY_CARE_PROVIDER_SITE_OTHER): Payer: BC Managed Care – PPO | Admitting: Family Medicine

## 2023-09-14 VITALS — BP 136/78 | HR 77 | Temp 98.2°F | Ht 63.0 in | Wt 117.0 lb

## 2023-09-14 DIAGNOSIS — Z131 Encounter for screening for diabetes mellitus: Secondary | ICD-10-CM

## 2023-09-14 DIAGNOSIS — Z13220 Encounter for screening for lipoid disorders: Secondary | ICD-10-CM | POA: Diagnosis not present

## 2023-09-14 DIAGNOSIS — E559 Vitamin D deficiency, unspecified: Secondary | ICD-10-CM

## 2023-09-14 DIAGNOSIS — Z79899 Other long term (current) drug therapy: Secondary | ICD-10-CM

## 2023-09-14 DIAGNOSIS — Z0000 Encounter for general adult medical examination without abnormal findings: Secondary | ICD-10-CM | POA: Diagnosis not present

## 2023-09-14 LAB — CBC WITH DIFFERENTIAL/PLATELET
Basophils Absolute: 0.1 10*3/uL (ref 0.0–0.1)
Basophils Relative: 0.9 % (ref 0.0–3.0)
Eosinophils Absolute: 0.1 10*3/uL (ref 0.0–0.7)
Eosinophils Relative: 2.2 % (ref 0.0–5.0)
HCT: 44.5 % (ref 36.0–46.0)
Hemoglobin: 14.9 g/dL (ref 12.0–15.0)
Lymphocytes Relative: 19.8 % (ref 12.0–46.0)
Lymphs Abs: 1.3 10*3/uL (ref 0.7–4.0)
MCHC: 33.5 g/dL (ref 30.0–36.0)
MCV: 92.4 fL (ref 78.0–100.0)
Monocytes Absolute: 0.4 10*3/uL (ref 0.1–1.0)
Monocytes Relative: 6.3 % (ref 3.0–12.0)
Neutro Abs: 4.5 10*3/uL (ref 1.4–7.7)
Neutrophils Relative %: 70.8 % (ref 43.0–77.0)
Platelets: 188 10*3/uL (ref 150.0–400.0)
RBC: 4.81 Mil/uL (ref 3.87–5.11)
RDW: 14 % (ref 11.5–15.5)
WBC: 6.4 10*3/uL (ref 4.0–10.5)

## 2023-09-14 LAB — BASIC METABOLIC PANEL
BUN: 11 mg/dL (ref 6–23)
CO2: 29 meq/L (ref 19–32)
Calcium: 9.8 mg/dL (ref 8.4–10.5)
Chloride: 100 meq/L (ref 96–112)
Creatinine, Ser: 0.64 mg/dL (ref 0.40–1.20)
GFR: 94.92 mL/min (ref >60.00)
Glucose, Bld: 93 mg/dL (ref 70–99)
Potassium: 4.7 meq/L (ref 3.5–5.1)
Sodium: 139 meq/L (ref 135–145)

## 2023-09-14 LAB — HEPATIC FUNCTION PANEL
ALT: 13 U/L (ref 0–35)
AST: 18 U/L (ref 0–37)
Albumin: 4.7 g/dL (ref 3.5–5.2)
Alkaline Phosphatase: 68 U/L (ref 39–117)
Bilirubin, Direct: 0.3 mg/dL (ref 0.0–0.3)
Total Bilirubin: 1.6 mg/dL — ABNORMAL HIGH (ref 0.2–1.2)
Total Protein: 7 g/dL (ref 6.0–8.3)

## 2023-09-14 LAB — LIPID PANEL
Cholesterol: 182 mg/dL (ref 0–200)
HDL: 96.5 mg/dL (ref >39.00)
LDL Cholesterol: 67 mg/dL (ref 0–99)
NonHDL: 85.16
Total CHOL/HDL Ratio: 2
Triglycerides: 93 mg/dL (ref 0.0–149.0)
VLDL: 18.6 mg/dL (ref 0.0–40.0)

## 2023-09-14 LAB — TSH: TSH: 1.77 u[IU]/mL (ref 0.35–5.50)

## 2023-09-14 LAB — HEMOGLOBIN A1C: Hgb A1c MFr Bld: 5.3 % (ref 4.6–6.5)

## 2023-09-14 LAB — VITAMIN D 25 HYDROXY (VIT D DEFICIENCY, FRACTURES): VITD: 41.65 ng/mL (ref 30.00–100.00)

## 2023-10-10 ENCOUNTER — Other Ambulatory Visit: Payer: Self-pay | Admitting: Family Medicine

## 2024-01-03 ENCOUNTER — Other Ambulatory Visit: Payer: Self-pay | Admitting: Family Medicine

## 2024-01-04 NOTE — Telephone Encounter (Signed)
Last office visit 09/14/2023 for CPE.  Last refilled 09/03/2023 for #60 with 1 refills. Next Appt: No future appointments.

## 2024-05-02 ENCOUNTER — Other Ambulatory Visit: Payer: Self-pay | Admitting: Family Medicine

## 2024-05-03 NOTE — Telephone Encounter (Signed)
 Last office visit 10/07/02024 for CPE.  Last refilled 01/04/2024 for #60 with 1 refill.  Next Appt: No future appointments.

## 2024-06-17 ENCOUNTER — Ambulatory Visit
Admission: RE | Admit: 2024-06-17 | Discharge: 2024-06-17 | Disposition: A | Discharge status: Home / Self Care | Source: Ambulatory Visit | Attending: Family Medicine | Admitting: Family Medicine

## 2024-06-17 DIAGNOSIS — Z1231 Encounter for screening mammogram for malignant neoplasm of breast: Secondary | ICD-10-CM | POA: Insufficient documentation

## 2024-08-24 ENCOUNTER — Other Ambulatory Visit: Payer: Self-pay | Admitting: Family Medicine

## 2024-08-24 MED ORDER — ALPRAZOLAM 0.5 MG PO TABS: ORAL_TABLET | ORAL | 2 refills | Status: AC

## 2024-08-24 NOTE — Telephone Encounter (Addendum)
 Last office visit 09/14/2023 for CPE.  Last refilled 05/03/2024 for #60 with 1 refill.  Next appt: 09/14/2024. Spoke with patient.  She states she only needs her alprazolam  refilled.

## 2024-08-24 NOTE — Addendum Note (Signed)
 Addended by: WENDELL ARLAND RAMAN on: 08/24/2024 09:31 AM   Modules accepted: Orders

## 2024-08-24 NOTE — Telephone Encounter (Signed)
 Copied from CRM [id number]. Topic: Clinical - Medication Question >> Aug 23, 2024  3:36 PM Thersia C wrote: Reason for CRM: ALPRAZolam  (XANAX ) 0.5 MG tablet  citalopram  (CELEXA ) 40 MG tablet salmeterol (SEREVENT  DISKUS) 50 MCG/ACT diskus inhaler   Patient called in regarding these prescription, stated she is in the process of updating insurance and wanted to know if she will be able to refill medication until the first two weeks of October would like a callback regarding this

## 2024-09-11 ENCOUNTER — Encounter: Payer: Self-pay | Admitting: Family Medicine
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
## 2024-09-14 ENCOUNTER — Ambulatory Visit (INDEPENDENT_AMBULATORY_CARE_PROVIDER_SITE_OTHER): Admitting: Family Medicine

## 2024-09-14 ENCOUNTER — Encounter: Payer: Self-pay | Admitting: Family Medicine

## 2024-09-14 VITALS — BP 130/82 | HR 69 | Temp 97.1°F | Ht 62.75 in | Wt 126.5 lb

## 2024-09-14 DIAGNOSIS — Z1211 Encounter for screening for malignant neoplasm of colon: Secondary | ICD-10-CM | POA: Diagnosis not present

## 2024-09-14 DIAGNOSIS — Z79899 Other long term (current) drug therapy: Secondary | ICD-10-CM | POA: Diagnosis not present

## 2024-09-14 DIAGNOSIS — Z131 Encounter for screening for diabetes mellitus: Secondary | ICD-10-CM | POA: Diagnosis not present

## 2024-09-14 DIAGNOSIS — Z13220 Encounter for screening for lipoid disorders: Secondary | ICD-10-CM | POA: Diagnosis not present

## 2024-09-14 DIAGNOSIS — Z0000 Encounter for general adult medical examination without abnormal findings: Secondary | ICD-10-CM

## 2024-09-14 LAB — CBC WITH DIFFERENTIAL/PLATELET
Basophils Absolute: 0.1 K/uL (ref 0.0–0.1)
Basophils Relative: 1.1 % (ref 0.0–3.0)
Eosinophils Absolute: 0.3 K/uL (ref 0.0–0.7)
Eosinophils Relative: 5.4 % — ABNORMAL HIGH (ref 0.0–5.0)
HCT: 44.7 % (ref 36.0–46.0)
Hemoglobin: 15.3 g/dL — ABNORMAL HIGH (ref 12.0–15.0)
Lymphocytes Relative: 26.1 % (ref 12.0–46.0)
Lymphs Abs: 1.4 K/uL (ref 0.7–4.0)
MCHC: 34.3 g/dL (ref 30.0–36.0)
MCV: 91.9 fl (ref 78.0–100.0)
Monocytes Absolute: 0.4 K/uL (ref 0.1–1.0)
Monocytes Relative: 7.6 % (ref 3.0–12.0)
Neutro Abs: 3.2 K/uL (ref 1.4–7.7)
Neutrophils Relative %: 59.8 % (ref 43.0–77.0)
Platelets: 172 K/uL (ref 150.0–400.0)
RBC: 4.86 Mil/uL (ref 3.87–5.11)
RDW: 13.1 % (ref 11.5–15.5)
WBC: 5.3 K/uL (ref 4.0–10.5)

## 2024-09-14 LAB — HEMOGLOBIN A1C: Hgb A1c MFr Bld: 5.4 % (ref 4.6–6.5)

## 2024-09-14 LAB — LIPID PANEL
Cholesterol: 183 mg/dL (ref 0–200)
HDL: 88 mg/dL (ref >39.00)
LDL Cholesterol: 72 mg/dL (ref 0–99)
NonHDL: 95.46
Total CHOL/HDL Ratio: 2
Triglycerides: 117 mg/dL (ref 0.0–149.0)
VLDL: 23.4 mg/dL (ref 0.0–40.0)

## 2024-09-14 LAB — TSH: TSH: 1.56 u[IU]/mL (ref 0.35–5.50)

## 2024-09-14 LAB — HEPATIC FUNCTION PANEL
ALT: 18 U/L (ref 0–35)
AST: 21 U/L (ref 0–37)
Albumin: 5 g/dL (ref 3.5–5.2)
Alkaline Phosphatase: 58 U/L (ref 39–117)
Bilirubin, Direct: 0.2 mg/dL (ref 0.0–0.3)
Total Bilirubin: 1.2 mg/dL (ref 0.2–1.2)
Total Protein: 7.1 g/dL (ref 6.0–8.3)

## 2024-09-14 LAB — BASIC METABOLIC PANEL WITH GFR
BUN: 14 mg/dL (ref 6–23)
CO2: 30 meq/L (ref 19–32)
Calcium: 9.8 mg/dL (ref 8.4–10.5)
Chloride: 99 meq/L (ref 96–112)
Creatinine, Ser: 0.64 mg/dL (ref 0.40–1.20)
GFR: 94.25 mL/min (ref >60.00)
Glucose, Bld: 100 mg/dL — ABNORMAL HIGH (ref 70–99)
Potassium: 4.8 meq/L (ref 3.5–5.1)
Sodium: 139 meq/L (ref 135–145)

## 2024-09-14 MED ORDER — SEREVENT DISKUS 50 MCG/ACT IN AEPB: 1.0000 | INHALATION_SPRAY | Freq: Two times a day (BID) | RESPIRATORY_TRACT | 1 refills | Status: DC

## 2024-09-16 ENCOUNTER — Ambulatory Visit: Payer: Self-pay | Admitting: Family Medicine

## 2024-10-11 DIAGNOSIS — Z1211 Encounter for screening for malignant neoplasm of colon: Secondary | ICD-10-CM | POA: Diagnosis not present

## 2024-10-19 LAB — COLOGUARD: COLOGUARD: NEGATIVE

## 2024-10-21 ENCOUNTER — Other Ambulatory Visit: Payer: Self-pay | Admitting: Family Medicine

## 2024-12-09 ENCOUNTER — Telehealth: Payer: Self-pay | Admitting: Family Medicine

## 2024-12-09 NOTE — Telephone Encounter (Signed)
 Copied from CRM #[id number]. Topic: Clinical - Prescription Issue >> Dec 09, 2024  8:48 AM Rea BROCKS wrote: Reason for CRM: Patient called in for serevent  refill and [facility] told patient that they were backordered and had no idea when they would get it. They said it was no [facility]. Patient ask if there's an alternate prescription that can be called in for now.   [phone] (M)

## 2024-12-11 ENCOUNTER — Other Ambulatory Visit: Payer: Self-pay | Admitting: Family Medicine

## 2024-12-11 ENCOUNTER — Encounter: Payer: Self-pay | Admitting: Family Medicine

## 2024-12-11 MED ORDER — FLUTICASONE-SALMETEROL 100-50 MCG/ACT IN AEPB: 1.0000 | INHALATION_SPRAY | Freq: Two times a day (BID) | RESPIRATORY_TRACT | 3 refills | Status: AC

## 2024-12-11 NOTE — Telephone Encounter (Signed)
 Ok.  I sent her in some Advair, which has the same Salmerterol in it, but it also has some inhaled steroids, too.  At this point, the other long-acting inhalers all have steroid in them, too.

## 2024-12-12 NOTE — Telephone Encounter (Signed)
Teana notified as instructed by telephone.  Patient states understanding. 

## 2024-12-12 NOTE — Telephone Encounter (Signed)
 Copied from CRM #[id number]. Topic: Clinical - Prescription Issue >> Dec 09, 2024  8:48 AM Rea BROCKS wrote: Reason for CRM: Patient called in for serevent  refill and [facility] told [facility] that they were backordered and had no idea when they would get it. They said it was no [facility] around that [facility] it. Patient ask if there's an alternate prescription that can be called in for now.   [phone] BENNIE) >> Dec 09, 2024  5:42 PM Alfonso ORN wrote: Pt called for f/u on request. Advised clinic currently closed and awaiting response. >> Dec 09, 2024  4:11 PM Alexandria E wrote: Patient called back to check if an alternative has been sent in. Relayed that message was sent over, just waiting for PCP to review request. >> Dec 09, 2024  1:56 PM Delon T wrote: Patient calling for follow up on the inhaler, notbody has it in [phone]- needs an alternative - patient is completely out of inhalers
